# Patient Record
Sex: Female | Born: 1940 | Race: White | Hispanic: No | Marital: Married | Smoking: Former smoker
Health system: Southern US, Community
[De-identification: ages and names within clinical notes are randomized; demographics above are authoritative.]

## PROBLEM LIST (undated history)

## (undated) DIAGNOSIS — E785 Hyperlipidemia, unspecified: Secondary | ICD-10-CM

## (undated) DIAGNOSIS — F329 Major depressive disorder, single episode, unspecified: Secondary | ICD-10-CM

## (undated) DIAGNOSIS — F32A Depression, unspecified: Secondary | ICD-10-CM

## (undated) DIAGNOSIS — F0390 Unspecified dementia without behavioral disturbance: Secondary | ICD-10-CM

## (undated) DIAGNOSIS — F419 Anxiety disorder, unspecified: Secondary | ICD-10-CM

## (undated) HISTORY — PX: COLONOSCOPY: SHX174

---

## 2010-02-22 ENCOUNTER — Emergency Department: Payer: Self-pay | Admitting: Internal Medicine

## 2010-08-05 ENCOUNTER — Ambulatory Visit: Payer: Self-pay

## 2010-08-16 ENCOUNTER — Ambulatory Visit: Payer: Self-pay | Admitting: Gastroenterology

## 2010-08-18 LAB — PATHOLOGY REPORT

## 2017-07-05 ENCOUNTER — Emergency Department: Payer: Medicare Other

## 2017-07-05 ENCOUNTER — Inpatient Hospital Stay
Admission: EM | Admit: 2017-07-05 | Discharge: 2017-07-08 | DRG: 470 | Disposition: A | Discharge status: Skilled Nursing Facility | Payer: Medicare Other | Attending: Internal Medicine | Admitting: Internal Medicine

## 2017-07-05 DIAGNOSIS — S72002A Fracture of unspecified part of neck of left femur, initial encounter for closed fracture: Secondary | ICD-10-CM | POA: Diagnosis present

## 2017-07-05 DIAGNOSIS — E876 Hypokalemia: Secondary | ICD-10-CM | POA: Diagnosis present

## 2017-07-05 DIAGNOSIS — Z880 Allergy status to penicillin: Secondary | ICD-10-CM

## 2017-07-05 DIAGNOSIS — Z419 Encounter for procedure for purposes other than remedying health state, unspecified: Secondary | ICD-10-CM

## 2017-07-05 DIAGNOSIS — W19XXXA Unspecified fall, initial encounter: Secondary | ICD-10-CM | POA: Diagnosis present

## 2017-07-05 DIAGNOSIS — X58XXXA Exposure to other specified factors, initial encounter: Secondary | ICD-10-CM | POA: Diagnosis present

## 2017-07-05 DIAGNOSIS — Z66 Do not resuscitate: Secondary | ICD-10-CM | POA: Diagnosis present

## 2017-07-05 DIAGNOSIS — Z87891 Personal history of nicotine dependence: Secondary | ICD-10-CM

## 2017-07-05 DIAGNOSIS — E785 Hyperlipidemia, unspecified: Secondary | ICD-10-CM | POA: Diagnosis present

## 2017-07-05 DIAGNOSIS — G8918 Other acute postprocedural pain: Secondary | ICD-10-CM

## 2017-07-05 DIAGNOSIS — F0390 Unspecified dementia without behavioral disturbance: Secondary | ICD-10-CM | POA: Diagnosis present

## 2017-07-05 DIAGNOSIS — S72012A Unspecified intracapsular fracture of left femur, initial encounter for closed fracture: Secondary | ICD-10-CM

## 2017-07-05 HISTORY — DX: Hyperlipidemia, unspecified: E78.5

## 2017-07-05 HISTORY — DX: Anxiety disorder, unspecified: F41.9

## 2017-07-05 HISTORY — DX: Depression, unspecified: F32.A

## 2017-07-05 HISTORY — DX: Unspecified dementia, unspecified severity, without behavioral disturbance, psychotic disturbance, mood disturbance, and anxiety: F03.90

## 2017-07-05 HISTORY — DX: Major depressive disorder, single episode, unspecified: F32.9

## 2017-07-05 LAB — TROPONIN I

## 2017-07-05 LAB — PROTIME-INR
INR: 0.94
PROTHROMBIN TIME: 12.6 s (ref 11.4–15.2)

## 2017-07-05 LAB — BASIC METABOLIC PANEL
ANION GAP: 9 (ref 5–15)
BUN: 16 mg/dL (ref 6–20)
CALCIUM: 9.1 mg/dL (ref 8.9–10.3)
CO2: 27 mmol/L (ref 22–32)
CREATININE: 0.77 mg/dL (ref 0.44–1.00)
Chloride: 105 mmol/L (ref 101–111)
Glucose, Bld: 108 mg/dL — ABNORMAL HIGH (ref 65–99)
Potassium: 3.6 mmol/L (ref 3.5–5.1)
SODIUM: 141 mmol/L (ref 135–145)

## 2017-07-05 LAB — CBC
HEMATOCRIT: 39 % (ref 35.0–47.0)
Hemoglobin: 13.3 g/dL (ref 12.0–16.0)
MCH: 30.8 pg (ref 26.0–34.0)
MCHC: 34 g/dL (ref 32.0–36.0)
MCV: 90.5 fL (ref 80.0–100.0)
PLATELETS: 177 10*3/uL (ref 150–440)
RBC: 4.31 MIL/uL (ref 3.80–5.20)
RDW: 13.5 % (ref 11.5–14.5)
WBC: 8.4 10*3/uL (ref 3.6–11.0)

## 2017-07-05 LAB — TYPE AND SCREEN
ABO/RH(D): A POS
ANTIBODY SCREEN: NEGATIVE

## 2017-07-05 LAB — APTT: aPTT: 33 seconds (ref 24–36)

## 2017-07-05 NOTE — ED Triage Notes (Signed)
[facility]. Pt c/o L hip and L leg pain. Has leg bent up sitting on stretcher. Denies hitting head. Hx dementia. EMS denies blood thinner use. EMS reports no deformities seen to L side.

## 2017-07-05 NOTE — H&P (Addendum)
[facility] [place] at [facility]   PATIENT NAME: Donna Moss    MR#:  [id number]  DATE OF BIRTH:  [birth year]  DATE OF ADMISSION:  07/05/2017  PRIMARY CARE PHYSICIAN: Patient, No Pcp Per   REQUESTING/REFERRING PHYSICIAN: Scotty CourtStafford, MD  CHIEF COMPLAINT:   Chief Complaint  Patient presents with  . Fall  . Hip Pain    HISTORY OF PRESENT ILLNESS:  Donna Moss  is a 76 y.o. female who presents with Unwitnessed fall and subsequent left hip pain. Here in the ED on imaging she was found have a left hip fracture. Orthopedic surgery contacted and plans for likely operative repair tomorrow. Hospitalist called for admission.  PAST MEDICAL HISTORY:   Past Medical History:  Diagnosis Date  . Anxiety   . Dementia   . Depression   . HLD (hyperlipidemia)     PAST SURGICAL HISTORY:   Past Surgical History:  Procedure Laterality Date  . COLONOSCOPY      SOCIAL HISTORY:   Social History  Substance Use Topics  . Smoking status: Former Games developermoker  . Smokeless tobacco: Not on file  . Alcohol use No    FAMILY HISTORY:   Family History  Problem Relation Age of Onset  . Cancer Mother     DRUG ALLERGIES:   Allergies  Allergen Reactions  . Penicillins     MEDICATIONS AT HOME:   Prior to Admission medications   Not on File    REVIEW OF SYSTEMS:  Review of Systems  Unable to perform ROS: Dementia     VITAL SIGNS:   Vitals:   07/05/17 2130 07/05/17 2131 07/05/17 2230  BP: 139/77 139/77 (!) 159/75  Pulse: (!) 58 68   Resp: (!) 22 (!) 28 20  Temp:  97.8 F (36.6 C)   TempSrc:  Oral   SpO2: 94% 94%    Wt Readings from Last 3 Encounters:  No data found for Wt    PHYSICAL EXAMINATION:  Physical Exam  Vitals reviewed. Constitutional: She appears well-developed and well-nourished. No distress.  HENT:  Head: Normocephalic and atraumatic.  Mouth/Throat: Oropharynx is clear and moist.  Eyes: Pupils are equal, round, and reactive to  light. Conjunctivae and EOM are normal. No scleral icterus.  Neck: Normal range of motion. Neck supple. No JVD present. No thyromegaly present.  Cardiovascular: Normal rate, regular rhythm and intact distal pulses.  Exam reveals no gallop and no friction rub.   No murmur heard. Respiratory: Effort normal and breath sounds normal. No respiratory distress. She has no wheezes. She has no rales.  GI: Soft. Bowel sounds are normal. She exhibits no distension. There is no tenderness.  Musculoskeletal: Normal range of motion. She exhibits deformity (Left leg shortened). She exhibits no edema.  No arthritis, no gout  Lymphadenopathy:    She has no cervical adenopathy.  Neurological:  Unable to assess due to dementia  Skin: Skin is warm and dry. No rash noted. No erythema.  Psychiatric:  Unable to assess due to dementia    LABORATORY PANEL:   CBC  Recent Labs Lab 07/05/17 2235  WBC 8.4  HGB 13.3  HCT 39.0  PLT 177   ------------------------------------------------------------------------------------------------------------------  Chemistries   Recent Labs Lab 07/05/17 2235  NA 141  K 3.6  CL 105  CO2 27  GLUCOSE 108*  BUN 16  CREATININE 0.77  CALCIUM 9.1   ------------------------------------------------------------------------------------------------------------------  Cardiac Enzymes  Recent Labs Lab 07/05/17 2235  TROPONINI <0.03   ------------------------------------------------------------------------------------------------------------------  RADIOLOGY:  Dg Chest 1 View  Result Date: 07/05/2017 CLINICAL DATA:  Status post fall, with concern for chest injury. Initial encounter. EXAM: CHEST 1 VIEW COMPARISON:  None. FINDINGS: The lungs are well-aerated. Mild left basilar airspace opacity may reflect atelectasis or possibly mild infection. There is no evidence of pleural effusion or pneumothorax. The cardiomediastinal silhouette is borderline normal in size. No  acute osseous abnormalities are seen. Scattered vascular calcifications are seen. IMPRESSION: Mild left basilar airspace opacity may reflect atelectasis or possibly mild infection. No displaced rib fracture seen. Electronically Signed   By: Roanna Raider M.D.   On: 07/05/2017 22:34   Dg Hip Unilat W Or Wo Pelvis 2-3 Views Left  Result Date: 07/05/2017 CLINICAL DATA:  Status post fall, with left hip pain. Initial encounter. EXAM: DG HIP (WITH OR WITHOUT PELVIS) 2-3V LEFT COMPARISON:  None. FINDINGS: There is a mildly displaced subcapital fracture through the left femoral neck. Both femoral heads are seated normally within their respective acetabula. No significant degenerative change is appreciated. The sacroiliac joints are unremarkable in appearance. Calcifications are noted at the right lower quadrant. The visualized bowel gas pattern is grossly unremarkable in appearance. IMPRESSION: Mildly displaced subcapital fracture through the left femoral neck. Electronically Signed   By: Roanna Raider M.D.   On: 07/05/2017 22:34    EKG:   Orders placed or performed during the hospital encounter of 07/05/17  . EKG 12-Lead  . EKG 12-Lead    IMPRESSION AND PLAN:  Principal Problem:   Closed left hip fracture [facility]) - orthopedics surgery consulted, plans for likely operative repair. Cardiac risk assessment is below, patient has no risk factors   Active Problems:   HLD (hyperlipidemia) - continue home meds   Dementia - continue home meds  All the records are reviewed and case discussed with ED provider. Management plans discussed with the patient and/or family.  DVT PROPHYLAXIS: Mechanical only  GI PROPHYLAXIS: None  ADMISSION STATUS: Inpatient  CODE STATUS: DNR Code Status History    This patient does not have a recorded code status. Please follow your organizational policy for patients in this situation.    Advance Directive Documentation     Most Recent Value  Type of Advance Directive   Out of facility DNR (pink MOST or yellow form)  Pre-existing out of facility DNR order (yellow form or pink MOST form)  -  "MOST" Form in Place?  -      TOTAL TIME TAKING CARE OF THIS PATIENT: 45 minutes.   Waunita Sandstrom FIELDING 07/05/2017, 11:37 PM  [facility] [place] [facility]  Office  [phone]  CC: Primary care physician; Patient, No Pcp Per  Note:  This document was prepared using Dragon voice recognition software and may include unintentional dictation errors.

## 2017-07-05 NOTE — ED Notes (Signed)
Pt daughter states pt picks at her arm. Wrapped R FA with gauze in attempt to keep from getting infected.

## 2017-07-05 NOTE — ED Provider Notes (Signed)
[facility] Emergency Department Provider Note  ____________________________________________  Time seen: Approximately 11:02 PM  I have reviewed the triage vital signs and the nursing notes.   HISTORY  Chief Complaint Fall and Hip Pain  Level 5 Caveat: Portions of the History and Physical were unable to be obtained due to altered mental status from chronic dementia.   HPI Donna Moss is a 76 y.o. female sent to the ED due to an unwitnessed fall. Patient complains of left hip pain no other complaints. Denies hitting head. No neck pain or blood thinner use or vision changes. Daughter notes that the patient recently had one of her medications increased, she thinks it was Klonopin and that the patient has been increasingly unsteady on her feet with her medication regimen.  The pain is moderate intensity, worse with movement, better with remaining still. Nonradiating.   Past Medical History:  Diagnosis Date  . Anxiety   . Dementia   . Depression   . HLD (hyperlipidemia)      There are no active problems to display for this patient.    History reviewed. No pertinent surgical history.   Prior to Admission medications   Not on File     Allergies Penicillins   History reviewed. No pertinent family history.  Social History Social History  Substance Use Topics  . Smoking status: Former Games developer  . Smokeless tobacco: Not on file  . Alcohol use No    Review of Systems  Constitutional:   No fever or chills.  ENT:   No sore throat. No rhinorrhea. Cardiovascular:   No chest pain or syncope. Respiratory:   No dyspnea or cough. Gastrointestinal:   Negative for abdominal pain, vomiting and diarrhea.  Musculoskeletal:   Left hip pain as above All other systems reviewed and are negative except as documented above in ROS and HPI.  ____________________________________________   PHYSICAL EXAM:  VITAL SIGNS: ED Triage Vitals  Enc Vitals  Group     BP 07/05/17 2130 139/77     Pulse Rate 07/05/17 2130 (!) 58     Resp 07/05/17 2130 (!) 22     Temp 07/05/17 2131 97.8 F (36.6 C)     Temp Source 07/05/17 2131 Oral     SpO2 07/05/17 2130 94 %     Weight --      Height --      Head Circumference --      Peak Flow --      Pain Score --      Pain Loc --      Pain Edu? --      Excl. in GC? --     Vital signs reviewed, nursing assessments reviewed.   Constitutional:   Alert and orientedTo self. Not in distress. Eyes:   No scleral icterus.  EOMI. No nystagmus. No conjunctival pallor. PERRL. ENT   Head:   Normocephalic and atraumatic.   Nose:   No congestion/rhinnorhea.    Mouth/Throat:   MMM, no pharyngeal erythema. No peritonsillar mass.    Neck:   No meningismus. Full ROM Hematological/Lymphatic/Immunilogical:   No cervical lymphadenopathy. Cardiovascular:   RRR. Symmetric bilateral radial and DP pulses.  No murmurs.  Respiratory:   Normal respiratory effort without tachypnea/retractions. Breath sounds are clear and equal bilaterally. No wheezes/rales/rhonchi. Gastrointestinal:   Soft and nontender. Non distended. There is no CVA tenderness.  No rebound, rigidity, or guarding. Genitourinary:   deferred Musculoskeletal:   Mild tenderness at the left hip  joint particularly from the anterior aspect. Pain with hip flexion on the left. Right leg on affected.  No edema. Neurologic:   Normal speech and language.  Motor grossly intact. No gross focal neurologic deficits are appreciated.  Skin:    Skin is warm, dry and intact. No rash noted.  No petechiae, purpura, or bullae.  ____________________________________________    LABS (pertinent positives/negatives) (all labs ordered are listed, but only abnormal results are displayed) Labs Reviewed  CBC  BASIC METABOLIC PANEL  TROPONIN I  APTT  PROTIME-INR  TYPE AND SCREEN   ____________________________________________   EKG  Interpreted by me Sinus  rhythm rate of 59, normal axis intervals QRS ST segments and T waves  ____________________________________________    RADIOLOGY  Dg Chest 1 View  Result Date: 07/05/2017 CLINICAL DATA:  Status post fall, with concern for chest injury. Initial encounter. EXAM: CHEST 1 VIEW COMPARISON:  None. FINDINGS: The lungs are well-aerated. Mild left basilar airspace opacity may reflect atelectasis or possibly mild infection. There is no evidence of pleural effusion or pneumothorax. The cardiomediastinal silhouette is borderline normal in size. No acute osseous abnormalities are seen. Scattered vascular calcifications are seen. IMPRESSION: Mild left basilar airspace opacity may reflect atelectasis or possibly mild infection. No displaced rib fracture seen. Electronically Signed   By: Roanna RaiderJeffery  Chang M.D.   On: 07/05/2017 22:34   Dg Hip Unilat W Or Wo Pelvis 2-3 Views Left  Result Date: 07/05/2017 CLINICAL DATA:  Status post fall, with left hip pain. Initial encounter. EXAM: DG HIP (WITH OR WITHOUT PELVIS) 2-3V LEFT COMPARISON:  None. FINDINGS: There is a mildly displaced subcapital fracture through the left femoral neck. Both femoral heads are seated normally within their respective acetabula. No significant degenerative change is appreciated. The sacroiliac joints are unremarkable in appearance. Calcifications are noted at the right lower quadrant. The visualized bowel gas pattern is grossly unremarkable in appearance. IMPRESSION: Mildly displaced subcapital fracture through the left femoral neck. Electronically Signed   By: Roanna RaiderJeffery  Chang M.D.   On: 07/05/2017 22:34    ____________________________________________   PROCEDURES Procedures  ____________________________________________   INITIAL IMPRESSION / ASSESSMENT AND PLAN / ED COURSE  Pertinent labs & imaging results that were available during my care of the patient were reviewed by me and considered in my medical decision making (see chart for  details).  Patient presents with left hip pain after a fall. X-ray shows a subcapital left hip fracture, mildly displaced. Discussed with ortho Dr. Rosita KeaMenz who will evaluate, possible surgical repair tomorrow. Case discussed with hospitalist for admission. No other traumatic injuries identified. Labs ordered as preop workup.      ____________________________________________   FINAL CLINICAL IMPRESSION(S) / ED DIAGNOSES  Final diagnoses:  Closed subcapital fracture of femur, left, initial encounter (HCC)  Fall, initial encounter  Dementia without behavioral disturbance, unspecified dementia type      New Prescriptions   No medications on file     Portions of this note were generated with dragon dictation software. Dictation errors may occur despite best attempts at proofreading.    Sharman CheekStafford, Yordin Rhoda, MD 07/05/17 [phone]

## 2017-07-06 ENCOUNTER — Encounter
Admission: EM | Disposition: A | Discharge status: Skilled Nursing Facility | Payer: Self-pay | Source: Home / Self Care | Attending: Internal Medicine

## 2017-07-06 ENCOUNTER — Inpatient Hospital Stay: Payer: Medicare Other | Admitting: Anesthesiology

## 2017-07-06 ENCOUNTER — Inpatient Hospital Stay: Payer: Medicare Other

## 2017-07-06 HISTORY — PX: HIP ARTHROPLASTY: SHX981

## 2017-07-06 LAB — URINALYSIS, COMPLETE (UACMP) WITH MICROSCOPIC
Bilirubin Urine: NEGATIVE
GLUCOSE, UA: NEGATIVE mg/dL
Hgb urine dipstick: NEGATIVE
Ketones, ur: NEGATIVE mg/dL
Leukocytes, UA: NEGATIVE
Nitrite: NEGATIVE
PROTEIN: NEGATIVE mg/dL
Specific Gravity, Urine: 1.009 (ref 1.005–1.030)
pH: 8 (ref 5.0–8.0)

## 2017-07-06 LAB — BASIC METABOLIC PANEL
ANION GAP: 6 (ref 5–15)
Anion gap: 7 (ref 5–15)
BUN: 16 mg/dL (ref 6–20)
BUN: 11 mg/dL (ref 6–20)
CALCIUM: 8.8 mg/dL — AB (ref 8.9–10.3)
CHLORIDE: 105 mmol/L (ref 101–111)
CO2: 29 mmol/L (ref 22–32)
CO2: 28 mmol/L (ref 22–32)
CREATININE: 0.69 mg/dL (ref 0.44–1.00)
Calcium: 9.2 mg/dL (ref 8.9–10.3)
Chloride: 105 mmol/L (ref 101–111)
Creatinine, Ser: 0.64 mg/dL (ref 0.44–1.00)
GFR calc Af Amer: >60 mL/min (ref >60)
GFR calc non Af Amer: >60 mL/min (ref >60)
GLUCOSE: 97 mg/dL (ref 65–99)
GLUCOSE: 113 mg/dL — AB (ref 65–99)
Potassium: 2.9 mmol/L — ABNORMAL LOW (ref 3.5–5.1)
Potassium: 3.7 mmol/L (ref 3.5–5.1)
SODIUM: 141 mmol/L (ref 135–145)
SODIUM: 139 mmol/L (ref 135–145)

## 2017-07-06 LAB — CBC
HCT: 38.9 % (ref 35.0–47.0)
Hemoglobin: 13.1 g/dL (ref 12.0–16.0)
MCH: 30.6 pg (ref 26.0–34.0)
MCHC: 33.6 g/dL (ref 32.0–36.0)
MCV: 90.9 fL (ref 80.0–100.0)
PLATELETS: 173 10*3/uL (ref 150–440)
RBC: 4.28 MIL/uL (ref 3.80–5.20)
RDW: 13.4 % (ref 11.5–14.5)
WBC: 7.2 10*3/uL (ref 3.6–11.0)

## 2017-07-06 LAB — TSH: TSH: 1.79 u[IU]/mL (ref 0.350–4.500)

## 2017-07-06 LAB — MAGNESIUM: MAGNESIUM: 1.8 mg/dL (ref 1.7–2.4)

## 2017-07-06 LAB — MRSA PCR SCREENING: MRSA by PCR: NEGATIVE

## 2017-07-06 SURGERY — HEMIARTHROPLASTY, HIP, DIRECT ANTERIOR APPROACH, FOR FRACTURE
Anesthesia: General | Site: Hip | Laterality: Left | Wound class: Clean

## 2017-07-06 MED ORDER — BUPIVACAINE-EPINEPHRINE 0.25% -1:200000 IJ SOLN
INTRAMUSCULAR | Status: DC | PRN
  Administered 2017-07-06: 30 mL

## 2017-07-06 MED ORDER — MAGNESIUM HYDROXIDE 400 MG/5ML PO SUSP: 30.0000 mL | Freq: Every day | ORAL | Status: DC | PRN

## 2017-07-06 MED ORDER — POTASSIUM CHLORIDE 10 MEQ/100ML IV SOLN
10.0000 meq | INTRAVENOUS | Status: DC | PRN
  Administered 2017-07-06 (×2): 10 meq via INTRAVENOUS
  Filled 2017-07-06 (×3): qty 100

## 2017-07-06 MED ORDER — METOPROLOL TARTRATE 5 MG/5ML IV SOLN
INTRAVENOUS | Status: AC
  Administered 2017-07-06: 2.5 mg via INTRAVENOUS
  Filled 2017-07-06: qty 5

## 2017-07-06 MED ORDER — DOCUSATE SODIUM 100 MG PO CAPS
100.0000 mg | ORAL_CAPSULE | Freq: Two times a day (BID) | ORAL | Status: DC
  Administered 2017-07-07 (×2): 100 mg via ORAL
  Filled 2017-07-06 (×3): qty 1

## 2017-07-06 MED ORDER — ONDANSETRON HCL 4 MG/2ML IJ SOLN
INTRAMUSCULAR | Status: AC
  Filled 2017-07-06: qty 2

## 2017-07-06 MED ORDER — METOPROLOL TARTRATE 5 MG/5ML IV SOLN
2.5000 mg | Freq: Once | INTRAVENOUS | Status: AC
  Administered 2017-07-06: 2.5 mg via INTRAVENOUS

## 2017-07-06 MED ORDER — ENOXAPARIN SODIUM 30 MG/0.3ML ~~LOC~~ SOLN: 30.0000 mg | SUBCUTANEOUS | Status: DC

## 2017-07-06 MED ORDER — SUGAMMADEX SODIUM 200 MG/2ML IV SOLN
INTRAVENOUS | Status: DC | PRN
  Administered 2017-07-06: 100 mg via INTRAVENOUS

## 2017-07-06 MED ORDER — MORPHINE SULFATE (PF) 2 MG/ML IV SOLN: 1.0000 mg | INTRAVENOUS | Status: DC | PRN

## 2017-07-06 MED ORDER — ROCURONIUM BROMIDE 100 MG/10ML IV SOLN
INTRAVENOUS | Status: DC | PRN
  Administered 2017-07-06 (×2): 20 mg via INTRAVENOUS
  Administered 2017-07-06: 30 mg via INTRAVENOUS

## 2017-07-06 MED ORDER — DONEPEZIL HCL 5 MG PO TABS
10.0000 mg | ORAL_TABLET | Freq: Every day | ORAL | Status: DC
  Administered 2017-07-07: 10 mg via ORAL
  Filled 2017-07-06: qty 2
  Filled 2017-07-06 (×2): qty 1

## 2017-07-06 MED ORDER — BISACODYL 10 MG RE SUPP: 10.0000 mg | Freq: Every day | RECTAL | Status: DC | PRN

## 2017-07-06 MED ORDER — ENOXAPARIN SODIUM 40 MG/0.4ML ~~LOC~~ SOLN
40.0000 mg | SUBCUTANEOUS | Status: DC
  Administered 2017-07-07 – 2017-07-08 (×2): 40 mg via SUBCUTANEOUS
  Filled 2017-07-06 (×2): qty 0.4

## 2017-07-06 MED ORDER — FENTANYL CITRATE (PF) 100 MCG/2ML IJ SOLN: 25.0000 ug | INTRAMUSCULAR | Status: DC | PRN

## 2017-07-06 MED ORDER — ROCURONIUM BROMIDE 50 MG/5ML IV SOLN
INTRAVENOUS | Status: AC
  Filled 2017-07-06: qty 1

## 2017-07-06 MED ORDER — ACETAMINOPHEN 10 MG/ML IV SOLN
INTRAVENOUS | Status: DC | PRN
  Administered 2017-07-06: 1000 mg via INTRAVENOUS

## 2017-07-06 MED ORDER — DEXAMETHASONE SODIUM PHOSPHATE 10 MG/ML IJ SOLN
INTRAMUSCULAR | Status: DC | PRN
  Administered 2017-07-06: 10 mg via INTRAVENOUS

## 2017-07-06 MED ORDER — LIDOCAINE HCL (CARDIAC) 20 MG/ML IV SOLN
INTRAVENOUS | Status: DC | PRN
  Administered 2017-07-06: 60 mg via INTRAVENOUS

## 2017-07-06 MED ORDER — ONDANSETRON HCL 4 MG/2ML IJ SOLN
INTRAMUSCULAR | Status: DC | PRN
  Administered 2017-07-06: 4 mg via INTRAVENOUS

## 2017-07-06 MED ORDER — ONDANSETRON HCL 4 MG PO TABS: 4.0000 mg | ORAL_TABLET | Freq: Four times a day (QID) | ORAL | Status: DC | PRN

## 2017-07-06 MED ORDER — ENOXAPARIN SODIUM 40 MG/0.4ML ~~LOC~~ SOLN: 40.0000 mg | SUBCUTANEOUS | Status: DC

## 2017-07-06 MED ORDER — ONDANSETRON HCL 4 MG/2ML IJ SOLN: 4.0000 mg | Freq: Four times a day (QID) | INTRAMUSCULAR | Status: DC | PRN

## 2017-07-06 MED ORDER — SODIUM CHLORIDE 0.9 % IV SOLN
INTRAVENOUS | Status: AC
  Administered 2017-07-06: 02:00:00 via INTRAVENOUS

## 2017-07-06 MED ORDER — CLINDAMYCIN PHOSPHATE 600 MG/50ML IV SOLN
600.0000 mg | Freq: Once | INTRAVENOUS | Status: AC
  Administered 2017-07-06: 600 mg via INTRAVENOUS
  Filled 2017-07-06 (×2): qty 50

## 2017-07-06 MED ORDER — SODIUM CHLORIDE 0.9 % IV SOLN
INTRAVENOUS | Status: DC
  Administered 2017-07-06 – 2017-07-07 (×2): via INTRAVENOUS

## 2017-07-06 MED ORDER — FENTANYL CITRATE (PF) 100 MCG/2ML IJ SOLN
INTRAMUSCULAR | Status: DC | PRN
  Administered 2017-07-06 (×2): 25 ug via INTRAVENOUS
  Administered 2017-07-06: 50 ug via INTRAVENOUS
  Administered 2017-07-06 (×2): 25 ug via INTRAVENOUS

## 2017-07-06 MED ORDER — MAGNESIUM CITRATE PO SOLN
1.0000 | Freq: Once | ORAL | Status: DC | PRN
  Filled 2017-07-06: qty 296

## 2017-07-06 MED ORDER — PAROXETINE HCL 20 MG PO TABS
30.0000 mg | ORAL_TABLET | Freq: Every day | ORAL | Status: DC
  Administered 2017-07-07 – 2017-07-08 (×2): 30 mg via ORAL
  Filled 2017-07-06 (×2): qty 2

## 2017-07-06 MED ORDER — HYDROCODONE-ACETAMINOPHEN 5-325 MG PO TABS: 1.0000 | ORAL_TABLET | ORAL | Status: DC | PRN

## 2017-07-06 MED ORDER — PHENOL 1.4 % MT LIQD
1.0000 | OROMUCOSAL | Status: DC | PRN
  Filled 2017-07-06: qty 177

## 2017-07-06 MED ORDER — PRAVASTATIN SODIUM 20 MG PO TABS
20.0000 mg | ORAL_TABLET | Freq: Every day | ORAL | Status: DC
Start: 2017-07-06 — End: 2017-07-08
  Administered 2017-07-07: 20 mg via ORAL
  Filled 2017-07-06 (×2): qty 1

## 2017-07-06 MED ORDER — ACETAMINOPHEN 325 MG PO TABS: 650.0000 mg | ORAL_TABLET | Freq: Four times a day (QID) | ORAL | Status: DC | PRN

## 2017-07-06 MED ORDER — ONDANSETRON HCL 4 MG/2ML IJ SOLN: 4.0000 mg | Freq: Once | INTRAMUSCULAR | Status: DC | PRN

## 2017-07-06 MED ORDER — PROPOFOL 10 MG/ML IV BOLUS
INTRAVENOUS | Status: AC
  Filled 2017-07-06: qty 20

## 2017-07-06 MED ORDER — PROPOFOL 10 MG/ML IV BOLUS
INTRAVENOUS | Status: DC | PRN
  Administered 2017-07-06: 70 mg via INTRAVENOUS

## 2017-07-06 MED ORDER — POTASSIUM CHLORIDE 20 MEQ PO PACK: 80.0000 meq | PACK | Freq: Once | ORAL | Status: DC

## 2017-07-06 MED ORDER — LACTATED RINGERS IV SOLN
INTRAVENOUS | Status: DC | PRN
  Administered 2017-07-06 (×2): via INTRAVENOUS

## 2017-07-06 MED ORDER — OXYCODONE HCL 5 MG PO TABS: 5.0000 mg | ORAL_TABLET | ORAL | Status: DC | PRN

## 2017-07-06 MED ORDER — ACETAMINOPHEN 500 MG PO TABS
1000.0000 mg | ORAL_TABLET | Freq: Four times a day (QID) | ORAL | Status: AC
  Administered 2017-07-07: 1000 mg via ORAL
  Filled 2017-07-06: qty 2

## 2017-07-06 MED ORDER — NEOMYCIN-POLYMYXIN B GU 40-200000 IR SOLN
Status: DC | PRN
  Administered 2017-07-06: 4 mL

## 2017-07-06 MED ORDER — DIPHENHYDRAMINE HCL 12.5 MG/5ML PO ELIX
12.5000 mg | ORAL_SOLUTION | ORAL | Status: DC | PRN
  Administered 2017-07-07: 12.5 mg via ORAL
  Filled 2017-07-06: qty 5

## 2017-07-06 MED ORDER — ALUM & MAG HYDROXIDE-SIMETH 200-200-20 MG/5ML PO SUSP: 30.0000 mL | ORAL | Status: DC | PRN

## 2017-07-06 MED ORDER — MENTHOL 3 MG MT LOZG
1.0000 | LOZENGE | OROMUCOSAL | Status: DC | PRN
  Filled 2017-07-06: qty 9

## 2017-07-06 MED ORDER — ACETAMINOPHEN 650 MG RE SUPP: 650.0000 mg | Freq: Four times a day (QID) | RECTAL | Status: DC | PRN

## 2017-07-06 MED ORDER — FENTANYL CITRATE (PF) 100 MCG/2ML IJ SOLN
INTRAMUSCULAR | Status: AC
  Filled 2017-07-06: qty 2

## 2017-07-06 SURGICAL SUPPLY — 50 items
BLADE SAW SAG 18.5X105 (BLADE) ×3 IMPLANT
BNDG COHESIVE 6X5 TAN STRL LF (GAUZE/BANDAGES/DRESSINGS) ×9 IMPLANT
CANISTER SUCT 1200ML W/VALVE (MISCELLANEOUS) ×3 IMPLANT
CAPT HIP HEMI 2 ×3 IMPLANT
CATH FOL LEG HOLDER (MISCELLANEOUS) ×3 IMPLANT
CATH TRAY METER 16FR LF (MISCELLANEOUS) ×3 IMPLANT
CHLORAPREP W/TINT 26ML (MISCELLANEOUS) ×3 IMPLANT
DRAPE C-ARM XRAY 36X54 (DRAPES) ×3 IMPLANT
DRAPE INCISE IOBAN 66X60 STRL (DRAPES) IMPLANT
DRAPE POUCH INSTRU U-SHP 10X18 (DRAPES) ×3 IMPLANT
DRAPE SHEET LG 3/4 BI-LAMINATE (DRAPES) ×9 IMPLANT
DRAPE TABLE BACK 80X90 (DRAPES) ×3 IMPLANT
DRESSING SURGICEL FIBRLLR 1X2 (HEMOSTASIS) ×2 IMPLANT
DRSG OPSITE POSTOP 4X8 (GAUZE/BANDAGES/DRESSINGS) ×6 IMPLANT
DRSG SURGICEL FIBRILLAR 1X2 (HEMOSTASIS) ×6
ELECT BLADE 6.5 EXT (BLADE) ×3 IMPLANT
ELECT REM PT RETURN 9FT ADLT (ELECTROSURGICAL) ×3
ELECTRODE REM PT RTRN 9FT ADLT (ELECTROSURGICAL) ×1 IMPLANT
EVACUATOR 1/8 PVC DRAIN (DRAIN) IMPLANT
GLOVE BIOGEL PI IND STRL 9 (GLOVE) ×1 IMPLANT
GLOVE BIOGEL PI INDICATOR 9 (GLOVE) ×2
GLOVE SURG SYN 9.0  PF PI (GLOVE) ×4
GLOVE SURG SYN 9.0 PF PI (GLOVE) ×2 IMPLANT
GOWN SRG 2XL LVL 4 RGLN SLV (GOWNS) ×1 IMPLANT
GOWN STRL NON-REIN 2XL LVL4 (GOWNS) ×2
GOWN STRL REUS W/ TWL LRG LVL3 (GOWN DISPOSABLE) ×1 IMPLANT
GOWN STRL REUS W/TWL LRG LVL3 (GOWN DISPOSABLE) ×2
HOOD PEEL AWAY FLYTE STAYCOOL (MISCELLANEOUS) ×3 IMPLANT
KIT PREVENA INCISION MGT 13 (CANNISTER) ×3 IMPLANT
MAT BLUE FLOOR 46X72 FLO (MISCELLANEOUS) ×3 IMPLANT
NDL SAFETY 18GX1.5 (NEEDLE) ×3 IMPLANT
NEEDLE SPNL 18GX3.5 QUINCKE PK (NEEDLE) ×3 IMPLANT
NS IRRIG 1000ML POUR BTL (IV SOLUTION) ×3 IMPLANT
PACK HIP COMPR (MISCELLANEOUS) ×3 IMPLANT
SOL PREP PVP 2OZ (MISCELLANEOUS) ×3
SOLUTION PREP PVP 2OZ (MISCELLANEOUS) ×1 IMPLANT
SPONGE DRAIN TRACH 4X4 STRL 2S (GAUZE/BANDAGES/DRESSINGS) ×3 IMPLANT
STAPLER SKIN PROX 35W (STAPLE) ×3 IMPLANT
STRAP SAFETY BODY (MISCELLANEOUS) ×3 IMPLANT
SUT DVC 2 QUILL PDO  T11 36X36 (SUTURE) ×2
SUT DVC 2 QUILL PDO T11 36X36 (SUTURE) ×1 IMPLANT
SUT SILK 0 (SUTURE) ×2
SUT SILK 0 30XBRD TIE 6 (SUTURE) ×1 IMPLANT
SUT V-LOC 90 ABS DVC 3-0 CL (SUTURE) ×3 IMPLANT
SUT VIC AB 1 CT1 36 (SUTURE) ×3 IMPLANT
SYR 20CC LL (SYRINGE) ×3 IMPLANT
SYR 30ML LL (SYRINGE) ×3 IMPLANT
TAPE MICROFOAM 4IN (TAPE) ×3 IMPLANT
TOWEL OR 17X26 4PK STRL BLUE (TOWEL DISPOSABLE) ×3 IMPLANT
WND VAC CANISTER 500ML (MISCELLANEOUS) ×3 IMPLANT

## 2017-07-06 NOTE — Anesthesia Preprocedure Evaluation (Signed)
Anesthesia Evaluation  Patient identified by MRN, date of birth, ID band Patient awake    Reviewed: Allergy & Precautions, H&P , NPO status , Patient's Chart, lab work & pertinent test results, reviewed documented beta blocker date and time   History of Anesthesia Complications Negative for: history of anesthetic complications  Airway       Comment: Non-cooperative Dental  (+) Edentulous Upper, Edentulous Lower, Dental Advidsory Given   Pulmonary neg pulmonary ROS, former smoker,           Cardiovascular Exercise Tolerance: Good negative cardio ROS       Neuro/Psych PSYCHIATRIC DISORDERS (Dementia) negative neurological ROS  negative psych ROS   GI/Hepatic negative GI ROS, Neg liver ROS,   Endo/Other  negative endocrine ROS  Renal/GU negative Renal ROS  negative genitourinary   Musculoskeletal   Abdominal   Peds  Hematology negative hematology ROS (+)   Anesthesia Other Findings Past Medical History: No date: Anxiety No date: Dementia No date: Depression No date: HLD (hyperlipidemia)   Reproductive/Obstetrics negative OB ROS                             Anesthesia Physical Anesthesia Plan  ASA: III  Anesthesia Plan: General   Post-op Pain Management:    Induction: Intravenous  PONV Risk Score and Plan: 3 and Ondansetron, Dexamethasone and Midazolam  Airway Management Planned: Oral ETT  Additional Equipment:   Intra-op Plan:   Post-operative Plan: Extubation in OR  Informed Consent: I have reviewed the patients History and Physical, chart, labs and discussed the procedure including the risks, benefits and alternatives for the proposed anesthesia with the patient or authorized representative who has indicated his/her understanding and acceptance.   Dental Advisory Given  Plan Discussed with: Anesthesiologist, CRNA and Surgeon  Anesthesia Plan Comments:          Anesthesia Quick Evaluation

## 2017-07-06 NOTE — Op Note (Signed)
07/05/2017 - 07/06/2017  4:19 PM  PATIENT:  Donna AuerbachKarin E Moss  76 y.o. female  PRE-OPERATIVE DIAGNOSIS:  LEFT HIP FRACTURE, displaced femoral neck  POST-OPERATIVE DIAGNOSIS:  LEFT HIP FRACTURE, displaced femoral neck   PROCEDURE:  Procedure(s): ARTHROPLASTY BIPOLAR HIP (HEMIARTHROPLASTY) (Left)  SURGEON: Leitha SchullerMichael J Georgenia Salim, MD  ASSISTANTS: None  ANESTHESIA:   general  EBL:  Total I/O In: 200 [IV Piggyback:200] Out: 500 [Urine:200; Blood:300]  BLOOD ADMINISTERED:none  DRAINS: none   LOCAL MEDICATIONS USED:  MARCAINE     SPECIMEN:  Source of Specimen:  Left femoral head  DISPOSITION OF SPECIMEN:  PATHOLOGY  COUNTS:  YES  TOURNIQUET:  * No tourniquets in log *  IMPLANTS: Medacta AMIS 3 standard stem with 47 mm bipolar head and S 28 mm metal head  DICTATION: .Dragon Dictation   The patient was brought to the operating room and after spinal anesthesia was obtained patient was placed on the operative table with the ipsilateral foot into the Medacta attachment, contralateral leg on a well-padded table. C-arm was brought in and preop template x-ray taken. After prepping and draping in usual sterile fashion appropriate patient identification and timeout procedures were completed. Anterior approach to the hip was obtained and centered over the greater trochanter and TFL muscle. The subcutaneous tissue was incised hemostasis being achieved by electrocautery. TFL fascia was incised and the muscle retracted laterally deep retractor placed. The lateral femoral circumflex vessels were identified and l cauterized d. The anterior capsule was exposed and a capsulotomy performed. The neck was identified and a femoral neck cut carried out with a saw. The neck was removed and the head then removed with a corkscrew and measured. Trials were placed and a 47 seemed to fit best. 47 mm bipolar head then chosen. The leg was then externally rotated and ischiofemoral and pubofemoral releases carried out. The femur  was sequentially broached to a size 3, size standard trials were placed and the final components chosen. The 3 standard stem was inserted along with a metal S 28 mm head and 47 mm bipolar liner. The hip was reduced and was stable the wound was thoroughly irrigated . Fibrillar was placed along the posterior capsule and medial neck to minimize postop bleeding The deep fascia was closed using a heavy Quill after infiltration of 30 cc of quarter percent Sensorcaine with epinephrine.3-0 v-loc to close the skin with skin staples Xeroform and honeycomb dressing then applied  PLAN OF CARE: Continue as inpatient

## 2017-07-06 NOTE — Progress Notes (Signed)
[facility] [place] at [facility]   PATIENT NAME: Donna Moss    MR#:  [id number]  DATE OF BIRTH:  1941/10/05  SUBJECTIVE:  CHIEF COMPLAINT:   Chief Complaint  Patient presents with  . Fall  . Hip Pain     Lives in a memory care unit, came after a fall and fracture on her hip. Denies any complaints. As per daughter she frequently runs UTIs. At her baseline she walks without any support as per her daughter.  REVIEW OF SYSTEMS:   Because of dementia patient is not able to give a review of system. ROS  DRUG ALLERGIES:   Allergies  Allergen Reactions  . Penicillins     VITALS:  Blood pressure 140/62, pulse 72, temperature 98 F (36.7 C), temperature source Oral, resp. rate 16, height 5\' 4"  (1.626 m), weight 51.1 kg (112 lb 9.6 oz), SpO2 99 %.  PHYSICAL EXAMINATION:  GENERAL:  76 y.o.-year-old patient lying in the bed with no acute distress.  EYES: Pupils equal, round, reactive to light and accommodation. No scleral icterus. Extraocular muscles intact.  HEENT: Head atraumatic, normocephalic. Oropharynx and nasopharynx clear.  NECK:  Supple, no jugular venous distention. No thyroid enlargement, no tenderness.  LUNGS: Normal breath sounds bilaterally, no wheezing, rales,rhonchi or crepitation. No use of accessory muscles of respiration.  CARDIOVASCULAR: S1, S2 normal. No murmurs, rubs, or gallops.  ABDOMEN: Soft, nontender, nondistended. Bowel sounds present. No organomegaly or mass.  EXTREMITIES: No pedal edema, cyanosis, or clubbing.  NEUROLOGIC: Cranial nerves II through XII are intact. Muscle strength 4/5 in Both upper extremities, lower extremities did not more due to fracture in her hip. Sensation intact. Gait not checked.  PSYCHIATRIC: The patient is alert and oriented x 1.  SKIN: No obvious rash, lesion, or ulcer.   Physical Exam LABORATORY PANEL:   CBC  Recent Labs Lab 07/06/17 0250  WBC 7.2  HGB 13.1  HCT 38.9  PLT 173    ------------------------------------------------------------------------------------------------------------------  Chemistries   Recent Labs Lab 07/06/17 1144  NA 139  K 3.7  CL 105  CO2 28  GLUCOSE 113*  BUN 11  CREATININE 0.64  CALCIUM 8.8*  MG 1.8   ------------------------------------------------------------------------------------------------------------------  Cardiac Enzymes  Recent Labs Lab 07/05/17 2235  TROPONINI <0.03   ------------------------------------------------------------------------------------------------------------------  RADIOLOGY:  Dg Chest 1 View  Result Date: 07/05/2017 CLINICAL DATA:  Status post fall, with concern for chest injury. Initial encounter. EXAM: CHEST 1 VIEW COMPARISON:  None. FINDINGS: The lungs are well-aerated. Mild left basilar airspace opacity may reflect atelectasis or possibly mild infection. There is no evidence of pleural effusion or pneumothorax. The cardiomediastinal silhouette is borderline normal in size. No acute osseous abnormalities are seen. Scattered vascular calcifications are seen. IMPRESSION: Mild left basilar airspace opacity may reflect atelectasis or possibly mild infection. No displaced rib fracture seen. Electronically Signed   By: Roanna Raider M.D.   On: 07/05/2017 22:34   Dg Hip Unilat W Or Wo Pelvis 2-3 Views Left  Result Date: 07/05/2017 CLINICAL DATA:  Status post fall, with left hip pain. Initial encounter. EXAM: DG HIP (WITH OR WITHOUT PELVIS) 2-3V LEFT COMPARISON:  None. FINDINGS: There is a mildly displaced subcapital fracture through the left femoral neck. Both femoral heads are seated normally within their respective acetabula. No significant degenerative change is appreciated. The sacroiliac joints are unremarkable in appearance. Calcifications are noted at the right lower quadrant. The visualized bowel gas pattern is grossly unremarkable in appearance. IMPRESSION: Mildly displaced subcapital  fracture through the left femoral neck. Electronically Signed   By: Roanna RaiderJeffery  Chang M.D.   On: 07/05/2017 22:34    ASSESSMENT AND PLAN:   Principal Problem:   Closed left hip fracture (HCC) Active Problems:   HLD (hyperlipidemia)   Dementia   * Left hip Fracture   Plan is to do surgery today.   Patient is optimized for surgery, pain management and rehabilitation after surgery as allowed by orthopedic team.  * Fall   As per daughter the patient has gradual worsening in her strength and going down hill in her feeding and strength. We will check urinalysis as daughter complains patient is having frequent UTIs.   Check TSH also.  * HLD (hyperlipidemia) - continue home meds  * Dementia - continue home meds  All the records are reviewed and case discussed with ED provider. Management plans discussed with the patient and/or family.  DVT PROPHYLAXIS: Mechanical only  GI PROPHYLAXIS: None   All the records are reviewed and case discussed with Care Management/Social Workerr. Management plans discussed with the patient, family and they are in agreement.  CODE STATUS: full.  TOTAL TIME TAKING CARE OF THIS PATIENT: 35 minutes.     POSSIBLE D/C IN 1-2 DAYS, DEPENDING ON CLINICAL CONDITION.   Altamese DillingVACHHANI, Saliyah Gillin M.D on 07/06/2017   Between 7am to 6pm - Pager - [phone]  After 6pm go to [email or web address] - password [email or web address]  [facility] [place] [facility]  Office  [phone]  CC: Primary care physician; Patient, No Pcp Per  Note: This dictation was prepared with Dragon dictation along with smaller phrase technology. Any transcriptional errors that result from this process are unintentional.

## 2017-07-06 NOTE — Transfer of Care (Signed)
Immediate Anesthesia Transfer of Care Note  Patient: Donna Moss  Procedure(s) Performed: Procedure(s): ARTHROPLASTY BIPOLAR HIP (HEMIARTHROPLASTY) (Left)  Patient Location: PACU  Anesthesia Type:General  Level of Consciousness: patient cooperative and responds to stimulation  Airway & Oxygen Therapy: Patient Spontanous Breathing and Patient connected to face mask oxygen  Post-op Assessment: Report given to RN and Post -op Vital signs reviewed and stable  Post vital signs: Reviewed and stable  Last Vitals:  Vitals:   07/06/17 0753 07/06/17 1621  BP: 140/62   Pulse: 72   Resp: 16   Temp: 36.7 C (P) 36.8 C  SpO2: 99%     Last Pain:  Vitals:   07/06/17 1621  TempSrc:   PainSc: (P) Asleep         Complications: No apparent anesthesia complications

## 2017-07-06 NOTE — Progress Notes (Signed)
Pr with an order for lovenox 30mg  q 24hr. Pt w/ a crcl >7430ml/min and weight >45kg. Order increased to 40mg  daily per protocol  Olene FlossMelissa D Aviella Disbrow, Pharm.D, BCPS Clinical Pharmacist

## 2017-07-06 NOTE — Care Management Note (Signed)
Case Management Note  Patient Details  Name: Donna Moss MRN: [id number] Date of Birth: 12-09-1940  Subjective/Objective:  Chart reviewed. [facility] Was walking prior to admission. Case dicussed with attending. Patient will likely need SNF. CSW aware.                    Action/Plan:   Expected Discharge Date:  07/09/17               Expected Discharge Plan:  Home w Home Health Services  In-House Referral:  Clinical Social Work  Discharge planning Services  CM Consult  Post Acute Care Choice:    Choice offered to:     DME Arranged:    DME Agency:     HH Arranged:    HH Agency:     Status of Service:  In process, will continue to follow  If discussed at Long Length of Stay Meetings, dates discussed:    Additional Comments:  Marily MemosLisa M Warrene Kapfer, RN 07/06/2017, 10:57 AM

## 2017-07-06 NOTE — Consult Note (Signed)
Patient's 76 year old female who suffered a fall yesterday she suffers from significant confusion and is [facility]. She walks without assistive device. X-ray showed a displaced femoral neck fracture that is unstable without any significant arthritis On exam she is laying on her left side she is able flex extend the toes and the leg is not externally rotated. She has palpable pulses. Impression is displaced femoral neck fracture in a confused ambulator Plan is left hip hemiarthroplasty, site marked, risks, benefits, and alternatives discussed with patient's daughter. Potassium was low last evening and supplement given this morning hopefully we'll normalize for surgery later today

## 2017-07-06 NOTE — Progress Notes (Signed)
CH rounding unit visited with pt. Pt was lying on the bed not feeling good and family member were at bedside. CH talked to family members and noticed that they needed time to spent with pt. CH excused himself but promised to visit with pt as needed to provide pastoral support and care. CH provided pastoral support and a ministry of presence.

## 2017-07-06 NOTE — Anesthesia Post-op Follow-up Note (Signed)
Anesthesia QCDR form completed.        

## 2017-07-06 NOTE — Anesthesia Procedure Notes (Signed)
Procedure Name: Intubation Date/Time: 07/06/2017 3:04 PM Performed by: Silvana Newness Pre-anesthesia Checklist: Patient identified, Emergency Drugs available, Suction available, Patient being monitored and Timeout performed Patient Re-evaluated:Patient Re-evaluated prior to induction Oxygen Delivery Method: Circle system utilized Preoxygenation: Pre-oxygenation with 100% oxygen Induction Type: IV induction Ventilation: Mask ventilation without difficulty Laryngoscope Size: Mac and 3 Grade View: Grade I Tube type: Oral Tube size: 7.0 mm Number of attempts: 1 Airway Equipment and Method: Stylet Placement Confirmation: ETT inserted through vocal cords under direct vision,  positive ETCO2 and breath sounds checked- equal and bilateral Secured at: 17 cm Tube secured with: Tape Dental Injury: Teeth and Oropharynx as per pre-operative assessment

## 2017-07-06 NOTE — Progress Notes (Signed)
Patient potassium level was 2.9. Contacted Dr Rosita KeaMenz to question if patient needed to be on a tele monitor. He said no because we were supplementing and rechecking the potassium level in 2.5 hours.

## 2017-07-07 ENCOUNTER — Encounter: Payer: Self-pay | Admitting: Orthopedic Surgery

## 2017-07-07 LAB — CBC
HCT: 34.3 % — ABNORMAL LOW (ref 35.0–47.0)
Hemoglobin: 11.7 g/dL — ABNORMAL LOW (ref 12.0–16.0)
MCH: 30.5 pg (ref 26.0–34.0)
MCHC: 34.2 g/dL (ref 32.0–36.0)
MCV: 89.1 fL (ref 80.0–100.0)
PLATELETS: 174 10*3/uL (ref 150–440)
RBC: 3.85 MIL/uL (ref 3.80–5.20)
RDW: 13.3 % (ref 11.5–14.5)
WBC: 10.4 10*3/uL (ref 3.6–11.0)

## 2017-07-07 LAB — BASIC METABOLIC PANEL
ANION GAP: 8 (ref 5–15)
BUN: 10 mg/dL (ref 6–20)
CALCIUM: 8.4 mg/dL — AB (ref 8.9–10.3)
CHLORIDE: 106 mmol/L (ref 101–111)
CO2: 26 mmol/L (ref 22–32)
Creatinine, Ser: 0.58 mg/dL (ref 0.44–1.00)
GFR calc Af Amer: >60 mL/min (ref >60)
GFR calc non Af Amer: >60 mL/min (ref >60)
GLUCOSE: 121 mg/dL — AB (ref 65–99)
Potassium: 3.8 mmol/L (ref 3.5–5.1)
Sodium: 140 mmol/L (ref 135–145)

## 2017-07-07 MED ORDER — BISACODYL 10 MG RE SUPP: 10.0000 mg | Freq: Every day | RECTAL | 0 refills | Status: AC | PRN

## 2017-07-07 MED ORDER — ACETAMINOPHEN 325 MG PO TABS: 650.0000 mg | ORAL_TABLET | Freq: Four times a day (QID) | ORAL | 0 refills | Status: AC | PRN

## 2017-07-07 MED ORDER — ENOXAPARIN SODIUM 40 MG/0.4ML ~~LOC~~ SOLN: 40.0000 mg | SUBCUTANEOUS | 0 refills | Status: AC

## 2017-07-07 MED ORDER — CLONAZEPAM 0.5 MG PO TABS: 0.2500 mg | ORAL_TABLET | Freq: Every evening | ORAL | 0 refills | Status: AC | PRN

## 2017-07-07 MED ORDER — HYDROCODONE-ACETAMINOPHEN 5-325 MG PO TABS: 1.0000 | ORAL_TABLET | ORAL | 0 refills | Status: DC | PRN

## 2017-07-07 MED ORDER — DOCUSATE SODIUM 100 MG PO CAPS: 100.0000 mg | ORAL_CAPSULE | Freq: Two times a day (BID) | ORAL | 0 refills | Status: DC

## 2017-07-07 MED ORDER — TRAZODONE HCL 50 MG PO TABS: 50.0000 mg | ORAL_TABLET | Freq: Every day | ORAL | 0 refills | Status: DC

## 2017-07-07 NOTE — Progress Notes (Signed)
Physical Therapy Treatment Patient Details Name: Donna Moss MRN: [id number] DOB: 1941/06/10 Today's Date: 07/07/2017    History of Present Illness Pt is a 76 yo female, admitted to acute care with L hip fx on 8/8, which was surgically repaired on 8/9. Currently post-op day 1. Prior to admission, pt required assist, [facility]. Pt was independent with amb, using no AD. PMH: anxiety, dementia, depression, and HLD.     PT Comments    Pt is pleasantly confused and better able to follow 1-step commands this session. Pt performs bed mobility and transfers with MinA +2, and amb with ModA +2, due to impaired strength, endurance, balance, sequencing, and pain. Pt able to amb total of 3 ft, primarily limited by pain and confusion from hx of dementia. Overall, pt responded well to today's treatment with no adverse affects, and is progressing towards functional goals.. Pt would benefit from skilled PT to address the previously mentioned impairments and promote return to PLOF. Currently recommending SNF, pending d/c.   Follow Up Recommendations  SNF     Equipment Recommendations  None recommended by PT    Recommendations for Other Services       Precautions / Restrictions Precautions Precautions: Anterior Hip Precaution Comments: Per pt daughter: MD advised against pt crossing legs.  Restrictions Weight Bearing Restrictions: Yes Other Position/Activity Restrictions: L LE WBAT    Mobility  Bed Mobility Overal bed mobility: Needs Assistance Bed Mobility: Sit to Supine     Supine to sit: Min assist Sit to supine: Min assist;+2 for physical assistance   General bed mobility comments: Pt requirs minA +2 for safety, as she is highly impulsive. Pt following 1-step commands better this session.   Transfers Overall transfer level: Needs assistance Equipment used: 2 person hand held assist Transfers: Sit to/from Stand Sit to Stand: Min assist;+2 physical  assistance Stand pivot transfers: +2 physical assistance;Mod assist       General transfer comment: Pt requires minA +2 +2 for safety, primarily due to impulsivity and impaired safety awareness. Pt able to follow 1-step commands more consistently than prior session. Follows rhythmic and tactile cues best.   Ambulation/Gait Ambulation/Gait assistance: Mod assist;+2 physical assistance Ambulation Distance (Feet): 3 Feet Assistive device: 2 person hand held assist Gait Pattern/deviations: Shuffle;Decreased step length - right;Decreased step length - left Gait velocity: decreased   General Gait Details: Pt able to amb 3 ft with ModA +2, requiring Max cues. Pt with poor safety awareness and slight fear of amb due to increased levels of pain with amb. Required increased time to perform task.   Stairs            Wheelchair Mobility    Modified Rankin (Stroke Patients Only)       Balance Overall balance assessment: Needs assistance Sitting-balance support: Bilateral upper extremity supported;Feet supported Sitting balance-Leahy Scale: Fair Sitting balance - Comments: Pt requires CGA with sitting balance, primarily due to impulsivity and pain. Pt with notable increase in posterior sway, but able to self correct with tactile cues.    Standing balance support: Bilateral upper extremity supported Standing balance-Leahy Scale: Poor Standing balance comment: Pt with poor standing balance with B UE HHA, with tendency to present with generally flexed posture with post sway. Pt iunable to follow cues to correct posture for improved balance.  Cognition Arousal/Alertness: Awake/alert Behavior During Therapy: Impulsive;Restless Overall Cognitive Status: History of cognitive impairments - at baseline                                 General Comments: Pt restless and highly impulsive. Pt fidgeting and rubbing clothes and operative site  throughout session. Pt attempting to pull on IV. Pt unable to verbalize concerns. Primarily follows rhythmic verbal cues and tactile cueing.       Exercises      General Comments        Pertinent Vitals/Pain Pain Assessment: Faces Faces Pain Scale: Hurts a little bit Pain Location: L hip  Pain Descriptors / Indicators: Operative site guarding;Grimacing Pain Intervention(s): Limited activity within patient's tolerance;Monitored during session    Home Living Family/patient expects to be discharged to:: Other (Comment) [facility])   Available Help at Discharge: Available 24 hours/day Type of Home: Other(Comment) (Memory care unit) Home Access: Level entry   Home Layout: One level        Prior Function Level of Independence: Needs assistance    ADL's / Homemaking Assistance Needed: Pt. resides at the memory care unit at [facility]. Required assist with ADLs/IADLs. Walked without a walker. Comments: Pt daughter present to provide pt hx, as pt is with dementia and poor historian. Needs assist with all ALD's and IADL's at baseline, but independent with amb, using no AD   PT Goals (current goals can now be found in the care plan section) Acute Rehab PT Goals Patient Stated Goal: Pt. unable to state PT Goal Formulation: With patient/family Time For Goal Achievement: 07/21/17 Potential to Achieve Goals: Good Additional Goals Additional Goal #1: Pt to participate in supine L hip abd x5 reps for greater strength and return to PLOF.  Progress towards PT goals: Progressing toward goals    Frequency    BID      PT Plan Current plan remains appropriate    Co-evaluation              AM-PAC PT "6 Clicks" Daily Activity  Outcome Measure  Difficulty turning over in bed (including adjusting bedclothes, sheets and blankets)?: Total Difficulty moving from lying on back to sitting on the side of the bed? : Total Difficulty sitting down on and standing up  from a chair with arms (e.g., wheelchair, bedside commode, etc,.)?: Total Help needed moving to and from a bed to chair (including a wheelchair)?: A Lot Help needed walking in hospital room?: A Lot Help needed climbing 3-5 steps with a railing? : Total 6 Click Score: 8    End of Session Equipment Utilized During Treatment: Gait belt Activity Tolerance: Patient tolerated treatment well Patient left: in bed;with call bell/phone within reach;with bed alarm set;with family/visitor present;with SCD's reapplied Nurse Communication: Mobility status PT Visit Diagnosis: Unsteadiness on feet (R26.81);Other abnormalities of gait and mobility (R26.89);History of falling (Z91.81);Muscle weakness (generalized) (M62.81);Pain Pain - Right/Left: Left Pain - part of body: Hip     Time: 4098-1191 PT Time Calculation (min) (ACUTE ONLY): 15 min  Charges:                       G Codes:      Sharman Cheek PT, SPT  Latanya Maudlin 07/07/2017, 2:49 PM

## 2017-07-07 NOTE — Care Management Note (Signed)
Case Management Note  Patient Details  Name: Margurite AuerbachKarin E Lebo MRN: [id number] Date of Birth: [birth year]  Subjective/Objective:    PT recommending SNF. CSW aware. Please reconsult if needs arise. Will sign off.                 Action/Plan:   Expected Discharge Date:  07/09/17               Expected Discharge Plan:  Skilled Nursing Facility  In-House Referral:  Clinical Social Work  Discharge planning Services  CM Consult  Post Acute Care Choice:    Choice offered to:     DME Arranged:    DME Agency:     HH Arranged:    HH Agency:     Status of Service:  Completed, signed off  If discussed at [employer] of [employer] Meetings, dates discussed:    Additional Comments:  Marily MemosLisa M Arlander Gillen, RN 07/07/2017, 9:48 AM

## 2017-07-07 NOTE — Evaluation (Signed)
Physical Therapy Evaluation Patient Details Name: Donna AuerbachKarin E Heathcock MRN: [id number] DOB: 06-22-1941 Today's Date: 07/07/2017   History of Present Illness  Pt is a 76 yo female, admitted to acute care with L hip fx on 8/8, which was surgically repaired on 8/9. Currently post-op day 1. Prior to admission, pt required assist[facility]. Pt was independent with amb, using no AD. PMH: anxiety, dementia, depression, and HLD.   Clinical Impression  Pt is pleasantly confused. Pt performs bed mobility with minA and max cues, and tranfers with ModA +2, due to impaired strength, endurance, sequencing, and pain. Pt unable to amb, due to safety concerns.Follows one step commands intermittently, but highly impulsive and fidgeting (pulling clothes and IV) throughout session. Overall, pt responded well to today's treatment with no adverse affects. Pt would benefit from skilled PT to address the previously mentioned impairments and promote return to PLOF.Given significant weakness from baseline and other previously mentioned impairments, currently recommending SNF, pending d/c.      Follow Up Recommendations SNF    Equipment Recommendations  None recommended by PT    Recommendations for Other Services       Precautions / Restrictions Precautions Precautions: Anterior Hip Precaution Comments: Per pt daughter: MD advised against pt crossing legs.  Restrictions Weight Bearing Restrictions: Yes Other Position/Activity Restrictions: L LE WBAT      Mobility  Bed Mobility Overal bed mobility: Needs Assistance Bed Mobility: Supine to Sit     Supine to sit: Min assist     General bed mobility comments: Pt is minA with supine ot sit, though it take increased time, as pt only able to follow 1-step commands inconsistently. Pt impulsive and persistent to sit up at EOB on the R, rather than the left.   Transfers Overall transfer level: Needs assistance   Transfers: Stand  Pivot Transfers   Stand pivot transfers: +2 physical assistance;Mod assist       General transfer comment: Pt ModA +2 for stand pivot transfer. Pt unable to maintain full erect stadning, due to LE weakness, imapired endurance, and impaired ability to follow commands. Pt required ModA +2 for stadn pivot and was motivated to participate through providing pt cues to hug therapist, for better ant weightshift. Pt unable to utilize RW due to cognitive impairments, requires B HHA. Pt on RA, maintaining O2 >90%  Ambulation/Gait             General Gait Details: Defered due to safety concerns.   Stairs            Wheelchair Mobility    Modified Rankin (Stroke Patients Only)       Balance Overall balance assessment: Needs assistance Sitting-balance support: Bilateral upper extremity supported;Feet supported Sitting balance-Leahy Scale: Fair Sitting balance - Comments: Pt requires CGA with sitting balance, primarily due to impulsivity and pain. Pt with notable increase in posterior sway, but able to self correct with tactile cues.    Standing balance support: Bilateral upper extremity supported Standing balance-Leahy Scale: Poor Standing balance comment: Pt with poor standing balance with B UE HHA, with tendency to present with generally flexed posture with post sway. Pt iunable to follow cues to correct posture for improved balance.                              Pertinent Vitals/Pain Pain Assessment: Faces Faces Pain Scale: Hurts a little bit Pain Location: L hip  Pain Descriptors / Indicators: Operative site guarding;Grimacing Pain Intervention(s): Limited activity within patient's tolerance;Monitored during session;Ice applied    Home Living Family/patient expects to be discharged to:: Other (Comment) [facility])   Available Help at Discharge: Available 24 hours/day Type of Home: Other(Comment) (Memory care unit) Home Access: Level entry      Home Layout: One level        Prior Function Level of Independence: Needs assistance      ADL's / Homemaking Assistance Needed: Pt. resides at the memory care unit at [facility]. Required assist with ADLs/IADLs. Walked without a walker.  Comments: Pt daughter present to provide pt hx, as pt is with dementia and poor historian. Needs assist with all ALD's and IADL's at baseline, but independent with amb, using no AD     Hand Dominance   Dominant Hand: Right    Extremity/Trunk Assessment   Upper Extremity Assessment Upper Extremity Assessment: Overall WFL for tasks assessed    Lower Extremity Assessment Lower Extremity Assessment: Generalized weakness (MMT to B LE's grossly 4-/5)       Communication   Communication: Other (comment) (dementia, unabel to fully verbalize concerns. Poor historian)  Cognition Arousal/Alertness: Awake/alert Behavior During Therapy: Impulsive;Restless Overall Cognitive Status: History of cognitive impairments - at baseline                                 General Comments: Pt restless and highly impulsive. Pt fidgeting and rubbing clothes and operative site throughout session. Pt attempting to pull on IV. Pt unable to verbalize concerns. Primarily follows rhythmic verbal cues and tactile cueing.       General Comments      Exercises     Assessment/Plan    PT Assessment Patient needs continued PT services  PT Problem List Decreased strength;Decreased activity tolerance;Decreased balance;Decreased mobility;Decreased coordination;Decreased cognition;Decreased safety awareness;Decreased knowledge of precautions;Pain       PT Treatment Interventions DME instruction;Gait training;Functional mobility training;Therapeutic activities;Therapeutic exercise;Balance training;Neuromuscular re-education;Cognitive remediation;Patient/family education;Manual techniques    PT Goals (Current goals can be found in the Care Plan section)   Acute Rehab PT Goals Patient Stated Goal: Pt. unable to state PT Goal Formulation: With patient/family Time For Goal Achievement: 07/21/17 Potential to Achieve Goals: Good    Frequency BID   Barriers to discharge        Co-evaluation               AM-PAC PT "6 Clicks" Daily Activity  Outcome Measure Difficulty turning over in bed (including adjusting bedclothes, sheets and blankets)?: Total Difficulty moving from lying on back to sitting on the side of the bed? : Total Difficulty sitting down on and standing up from a chair with arms (e.g., wheelchair, bedside commode, etc,.)?: Total Help needed moving to and from a bed to chair (including a wheelchair)?: A Lot Help needed walking in hospital room?: Total Help needed climbing 3-5 steps with a railing? : Total 6 Click Score: 7    End of Session Equipment Utilized During Treatment: Gait belt Activity Tolerance: Patient tolerated treatment well Patient left: in chair;with call bell/phone within reach;with chair alarm set;with family/visitor present;with nursing/sitter in room;with SCD's reapplied Nurse Communication: Mobility status PT Visit Diagnosis: Unsteadiness on feet (R26.81);Other abnormalities of gait and mobility (R26.89);History of falling (Z91.81);Muscle weakness (generalized) (M62.81);Pain Pain - Right/Left: Left Pain - part of body: Hip    Time: 6045-4098 PT Time  Calculation (min) (ACUTE ONLY): 31 min   Charges:         PT G Codes:        Sharman Cheek PT, SPT  Latanya Maudlin 07/07/2017, 1:09 PM

## 2017-07-07 NOTE — Clinical Social Work Note (Signed)
Clinical Social Work Assessment  Patient Details  Name: Donna Moss MRN: [id number] Date of Birth: 10/25/1941  Date of referral:  07/07/17               Reason for consult:  Facility Placement                Permission sought to share information with:  Chartered certified accountant granted to share information::  Yes, Verbal Permission Granted  Name::      [place]::   [place]   Relationship::     Contact Information:     Housing/Transportation Living arrangements for the past 2 months:  [place] of Information:  Adult Children Patient Interpreter Needed:  None Criminal Activity/Legal Involvement Pertinent to Current Situation/Hospitalization:  No - Comment as needed Significant Relationships:  Adult Children Lives with:  Facility Resident Do you feel safe going back to the place where you live?  Yes Need for family participation in patient care:  Yes (Comment)  Care giving concerns:  Patient is a resident at [facility] memory care unit.    Social Worker assessment / plan:  Holiday representative (CSW) received SNF consult. PT is recommending SNF. CSW met with patient and her daughter Shirlean Mylar was at bedside. Per Shirlean Mylar she lives in [place] and her sister Joen Laura lives in [place] and is the main contact. Per Shirlean Mylar patient is a resident at [facility] memory care unit. CSW explained SNF process. Shirlean Mylar is agreeable to SNF search and prefers [facility]. CSW explained that medicare will pay for SNF if patient has a 3 night inpatient qualifying stay at a hospital (patient was admitted to inpatient on 07/05/17). Daughter verbalized her understanding. FL2 complete and faxed out.   CSW met with patient's other daughter Joen Laura at bedside and presented bed offers. Daughter chose [facility]. Per Seth Bake admissions coordinator at [facility] patient can come over the weekend to room 303-B. CSW sent D/C summary to [facility] today. CSW will continue to follow and assist as needed.   Employment status:  Retired, Disabled (Comment on whether or not currently receiving Disability) Insurance information:  Medicare PT Recommendations:  [place] / Referral to community resources:  [place]  Patient/Family's Response to care: Patient's daughters are agreeable for patient to go to [facility].   Patient/Family's Understanding of and Emotional Response to Diagnosis, Current Treatment, and Prognosis: Patient's daughters were very pleasant and thanked CSW for assistance.    Emotional Assessment Appearance:  Appears stated age Attitude/Demeanor/Rapport:  Unable to Assess Affect (typically observed):  Pleasant Orientation:  Oriented to Self, Fluctuating Orientation (Suspected and/or reported Sundowners) Alcohol / Substance use:  Not Applicable Psych involvement (Current and /or in the community):  No (Comment)  Discharge Needs  Concerns to be addressed:  Discharge Planning Concerns Readmission within the last 30 days:  No Current discharge risk:  Dependent with Mobility Barriers to Discharge:  Continued Medical Work up   [employer], Veronia Beets, LCSW 07/07/2017, 3:01 PM

## 2017-07-07 NOTE — Progress Notes (Signed)
[facility] [place] [facility]   PATIENT NAME: Donna Moss    MR#:  [id number]  DATE OF BIRTH:  [birth year]  SUBJECTIVE:  CHIEF COMPLAINT:   Chief Complaint  Patient presents with  . Fall  . Hip Pain     Lives in a memory care unit, came after a fall and fracture on her hip. Denies any complaints. As per daughter she frequently runs UTIs. At her baseline she walks without any support as per her daughter.  Status post surgery, tolerated well. Remained sleepy last evening after surgery but awake today. Does not have any complaints.  REVIEW OF SYSTEMS:   Because of dementia patient is not able to give a review of system. ROS  DRUG ALLERGIES:   Allergies  Allergen Reactions  . Penicillins     VITALS:  Blood pressure 124/66, pulse 71, temperature 98.9 F (37.2 C), temperature source Oral, resp. rate 18, height 5\' 4"  (1.626 m), weight 51.1 kg (112 lb 9.6 oz), SpO2 92 %.  PHYSICAL EXAMINATION:  GENERAL:  76 y.o.-year-old patient lying in the bed with no acute distress.  EYES: Pupils equal, round, reactive to light and accommodation. No scleral icterus. Extraocular muscles intact.  HEENT: Head atraumatic, normocephalic. Oropharynx and nasopharynx clear.  NECK:  Supple, no jugular venous distention. No thyroid enlargement, no tenderness.  LUNGS: Normal breath sounds bilaterally, no wheezing, rales,rhonchi or crepitation. No use of accessory muscles of respiration.  CARDIOVASCULAR: S1, S2 normal. No murmurs, rubs, or gallops.  ABDOMEN: Soft, nontender, nondistended. Bowel sounds present. No organomegaly or mass.  EXTREMITIES: No pedal edema, cyanosis, or clubbing.  NEUROLOGIC: Cranial nerves II through XII are intact. Muscle strength 4/5 in Both upper extremities, lower extremities did not more due to recent surgery. Sensation intact. Gait not checked.  PSYCHIATRIC: The patient is alert and oriented x 1.  SKIN: No obvious rash, lesion, or ulcer.   Physical  Exam LABORATORY PANEL:   CBC  Recent Labs Lab 07/07/17 0343  WBC 10.4  HGB 11.7*  HCT 34.3*  PLT 174   ------------------------------------------------------------------------------------------------------------------  Chemistries   Recent Labs Lab 07/06/17 1144 07/07/17 0343  NA 139 140  K 3.7 3.8  CL 105 106  CO2 28 26  GLUCOSE 113* 121*  BUN 11 10  CREATININE 0.64 0.58  CALCIUM 8.8* 8.4*  MG 1.8  --    ------------------------------------------------------------------------------------------------------------------  Cardiac Enzymes  Recent Labs Lab 07/05/17 2235  TROPONINI <0.03   ------------------------------------------------------------------------------------------------------------------  RADIOLOGY:  Dg Chest 1 View  Result Date: 07/05/2017 CLINICAL DATA:  Status post fall, with concern for chest injury. Initial encounter. EXAM: CHEST 1 VIEW COMPARISON:  None. FINDINGS: The lungs are well-aerated. Mild left basilar airspace opacity may reflect atelectasis or possibly mild infection. There is no evidence of pleural effusion or pneumothorax. The cardiomediastinal silhouette is borderline normal in size. No acute osseous abnormalities are seen. Scattered vascular calcifications are seen. IMPRESSION: Mild left basilar airspace opacity may reflect atelectasis or possibly mild infection. No displaced rib fracture seen. Electronically Signed   By: Roanna RaiderJeffery  Chang M.D.   On: 07/05/2017 22:34   Dg Hip Operative Unilat W Or W/o Pelvis Left  Result Date: 07/06/2017 CLINICAL DATA:  Left hip arthroplasty. EXAM: OPERATIVE LEFT HIP (WITH PELVIS IF PERFORMED) 1 VIEW TECHNIQUE: Fluoroscopic spot image(s) were submitted for interpretation post-operatively. COMPARISON:  None. FINDINGS: Intraoperative image demonstrates the proximal portion of a bipolar hemiarthroplasty of the left hip. The femoral stem is not completely visualized. Alignment appears  normal at the level of the hip.  IMPRESSION: Normal alignment of visualized left hip bipolar hemiarthroplasty. Electronically Signed   By: Irish Lack M.D.   On: 07/06/2017 16:18   Dg Hip Unilat W Or W/o Pelvis 2-3 Views Left  Result Date: 07/06/2017 CLINICAL DATA:  Post hip replacement.  Postop pain. EXAM: DG HIP (WITH OR WITHOUT PELVIS) 2-3V LEFT COMPARISON:  07/05/2017 FINDINGS: Interval left hemiarthroplasty. No periprosthetic fracture or other acute complication identified. Vascular calcifications. A right pelvic calcification is nonspecific. IMPRESSION: Expected appearance after left hemiarthroplasty. Electronically Signed   By: Jeronimo Greaves M.D.   On: 07/06/2017 17:02   Dg Hip Unilat W Or Wo Pelvis 2-3 Views Left  Result Date: 07/05/2017 CLINICAL DATA:  Status post fall, with left hip pain. Initial encounter. EXAM: DG HIP (WITH OR WITHOUT PELVIS) 2-3V LEFT COMPARISON:  None. FINDINGS: There is a mildly displaced subcapital fracture through the left femoral neck. Both femoral heads are seated normally within their respective acetabula. No significant degenerative change is appreciated. The sacroiliac joints are unremarkable in appearance. Calcifications are noted at the right lower quadrant. The visualized bowel gas pattern is grossly unremarkable in appearance. IMPRESSION: Mildly displaced subcapital fracture through the left femoral neck. Electronically Signed   By: Roanna Raider M.D.   On: 07/05/2017 22:34    ASSESSMENT AND PLAN:   Principal Problem:   Closed left hip fracture (HCC) Active Problems:   HLD (hyperlipidemia)   Dementia   * Left hip Fracture   S/p hemiarthroplasty on left hip- 07/06/17  * Fall   As per daughter the patient has gradual worsening in her strength and going down hill in her feeding and strength.    UA is negative, TSH - not high.  * HLD (hyperlipidemia) - continue home meds  * Dementia - continue home meds  * Hypokalemia- replaced.  All the records are reviewed and case discussed  with ED provider. Management plans discussed with the patient and/or family.  DVT PROPHYLAXIS: Mechanical only  GI PROPHYLAXIS: None   All the records are reviewed and case discussed with Care Management/Social Workerr. Management plans discussed with the patient, family and they are in agreement.  CODE STATUS: full.  TOTAL TIME TAKING CARE OF THIS PATIENT: 35 minutes.     POSSIBLE D/C IN 1-2 DAYS, DEPENDING ON CLINICAL CONDITION.   Altamese Dilling M.D on 07/07/2017   Between 7am to 6pm - Pager - [phone]  After 6pm go to [email or web address] - password [email or web address]  [facility]  Office  [phone]  CC: Primary care physician; Patient, No Pcp Per  Note: This dictation was prepared with Dragon dictation along with smaller phrase technology. Any transcriptional errors that result from this process are unintentional.

## 2017-07-07 NOTE — NC FL2 (Signed)
[place] MEDICAID FL2 LEVEL OF CARE SCREENING TOOL     IDENTIFICATION  Patient Name: Donna Moss Birthdate: 19-Jan-1941 Sex: female Admission Date (Current Location): 07/05/2017  [place] and [place] Number:  Chiropodist and Address:  [facility], [address], [place], [place] [address]      Provider Number: [id number]  Attending Physician Name and Address:  Altamese Dilling, *  Relative Name and Phone Number:       Current Level of Care: Hospital Recommended Level of Care: Skilled Nursing Facility Prior Approval Number:    Date Approved/Denied:   PASRR Number:  [id number])  Discharge Plan: SNF    Current Diagnoses: Patient Active Problem List   Diagnosis Date Noted  . Closed left hip fracture (HCC) 07/05/2017  . HLD (hyperlipidemia) 07/05/2017  . Dementia 07/05/2017    Orientation RESPIRATION BLADDER Height & Weight     Self  Normal Incontinent Weight: 112 lb 9.6 oz (51.1 kg) Height:  5\' 4"  (162.6 cm)  BEHAVIORAL SYMPTOMS/MOOD NEUROLOGICAL BOWEL NUTRITION STATUS   (none)  (none) Incontinent Diet (Diet: Clear Liquid to be Advanced. )  AMBULATORY STATUS COMMUNICATION OF NEEDS Skin   Extensive Assist Verbally Surgical wounds (Incision: Left Hip. )                       Personal Care Assistance Level of Assistance  Bathing, Feeding, Dressing Bathing Assistance: Limited assistance Feeding assistance: Independent Dressing Assistance: Limited assistance     Functional Limitations Info  Sight, Hearing, Speech Sight Info: Adequate Hearing Info: Adequate Speech Info: Adequate    SPECIAL CARE FACTORS FREQUENCY  PT (By licensed PT), OT (By licensed OT)     PT Frequency:  (5) OT Frequency:  (5)            Contractures      Additional Factors Info  Code Status, Allergies Code Status Info:  (DNR ) Allergies Info:  (Penicillins. )           Current Medications (07/07/2017):  This is the current  hospital active medication list Current Facility-Administered Medications  Medication Dose Route Frequency Provider Last Rate Last Dose  . 0.9 %  sodium chloride infusion   Intravenous Continuous Kennedy Bucker, MD 100 mL/hr at 07/07/17 0401    . acetaminophen (TYLENOL) tablet 650 mg  650 mg Oral Q6H PRN Oralia Manis, MD       Or  . acetaminophen (TYLENOL) suppository 650 mg  650 mg Rectal Q6H PRN Oralia Manis, MD      . acetaminophen (TYLENOL) tablet 1,000 mg  1,000 mg Oral Q6H Kennedy Bucker, MD   1,000 mg at 07/07/17 1148  . alum & mag hydroxide-simeth (MAALOX/MYLANTA) 200-200-20 MG/5ML suspension 30 mL  30 mL Oral Q4H PRN Kennedy Bucker, MD      . bisacodyl (DULCOLAX) suppository 10 mg  10 mg Rectal Daily PRN Kennedy Bucker, MD      . diphenhydrAMINE (BENADRYL) 12.5 MG/5ML elixir 12.5-25 mg  12.5-25 mg Oral Q4H PRN Kennedy Bucker, MD      . docusate sodium (COLACE) capsule 100 mg  100 mg Oral BID Kennedy Bucker, MD   100 mg at 07/07/17 1000  . donepezil (ARICEPT) tablet 10 mg  10 mg Oral QHS Oralia Manis, MD      . enoxaparin (LOVENOX) injection 40 mg  40 mg Subcutaneous Q24H Kennedy Bucker, MD   40 mg at 07/07/17 0800  . HYDROcodone-acetaminophen (NORCO/VICODIN) 5-325 MG per  tablet 1-2 tablet  1-2 tablet Oral Q4H PRN Kennedy BuckerMenz, Michael, MD      . magnesium citrate solution 1 Bottle  1 Bottle Oral Once PRN Kennedy BuckerMenz, Michael, MD      . magnesium hydroxide (MILK OF MAGNESIA) suspension 30 mL  30 mL Oral Daily PRN Kennedy BuckerMenz, Michael, MD      . menthol-cetylpyridinium (CEPACOL) lozenge 3 mg  1 lozenge Oral PRN Kennedy BuckerMenz, Michael, MD       Or  . phenol (CHLORASEPTIC) mouth spray 1 spray  1 spray Mouth/Throat PRN Kennedy BuckerMenz, Michael, MD      . morphine 2 MG/ML injection 1 mg  1 mg Intravenous Q2H PRN Kennedy BuckerMenz, Michael, MD      . ondansetron [facility](ZOFRAN) tablet 4 mg  4 mg Oral Q6H PRN Oralia ManisWillis, David, MD       Or  . ondansetron [facility](ZOFRAN) injection 4 mg  4 mg Intravenous Q6H PRN Oralia ManisWillis, David, MD      . oxyCODONE (Oxy IR/ROXICODONE)  immediate release tablet 5 mg  5 mg Oral Q4H PRN Oralia ManisWillis, David, MD      . PARoxetine (PAXIL) tablet 30 mg  30 mg Oral Daily Oralia ManisWillis, David, MD   30 mg at 07/07/17 1000  . potassium chloride (KLOR-CON) packet 80 mEq  80 mEq Oral Once Kennedy BuckerMenz, Michael, MD      . potassium chloride 10 mEq in 100 mL IVPB  10 mEq Intravenous Q1H PRN Kennedy BuckerMenz, Michael, MD 100 mL/hr at 07/06/17 1247 10 mEq at 07/06/17 1247  . pravastatin (PRAVACHOL) tablet 20 mg  20 mg Oral q1800 Oralia ManisWillis, David, MD         Discharge Medications: Please see discharge summary for a list of discharge medications.  Relevant Imaging Results:  Relevant Lab Results:   Additional Information  (SSN: [id number])  Sample, Darleen CrockerBailey M, LCSW

## 2017-07-07 NOTE — Progress Notes (Signed)
  Subjective: 1 Day Post-Op Procedure(s) (LRB): ARTHROPLASTY BIPOLAR HIP (HEMIARTHROPLASTY) (Left) Patient reports pain as mild.   Patient seen in rounds with Dr. Rosita KeaMenz. Patient is well, and has had no acute complaints or problems Plan is to go Skilled nursing facility after hospital stay. Negative for chest pain and shortness of breath Fever: no Gastrointestinal: Negative for nausea and vomiting  Objective: Vital signs in last 24 hours: Temp:  [97.5 F (36.4 C)-98.3 F (36.8 C)] 97.8 F (36.6 C) (08/10 0425) Pulse Rate:  [62-142] 63 (08/10 0425) Resp:  [16-25] 16 (08/10 0425) BP: (114-199)/(54-91) 154/61 (08/10 0425) SpO2:  [82 %-100 %] 99 % (08/10 0425)  Intake/Output from previous day:  Intake/Output Summary (Last 24 hours) at 07/07/17 0620 Last data filed at 07/07/17 0500  Gross per 24 hour  Intake          2156.67 ml  Output             1350 ml  Net           806.67 ml    Intake/Output this shift: Total I/O In: 916.7 [I.V.:916.7] Out: 650 [Urine:650]  Labs:  Recent Labs  07/05/17 2235 07/06/17 0250 07/07/17 0343  HGB 13.3 13.1 11.7*    Recent Labs  07/06/17 0250 07/07/17 0343  WBC 7.2 10.4  RBC 4.28 3.85  HCT 38.9 34.3*  PLT 173 174    Recent Labs  07/06/17 1144 07/07/17 0343  NA 139 140  K 3.7 3.8  CL 105 106  CO2 28 26  BUN 11 10  CREATININE 0.64 0.58  GLUCOSE 113* 121*  CALCIUM 8.8* 8.4*    Recent Labs  07/05/17 2235  INR 0.94     EXAM General - Patient is Confused and Resting Extremity - Intact pulses distally No cellulitis present Compartment soft Dressing/Incision - clean, dry, no drainage Motor Function - intact, moving foot and toes well on exam.   Past Medical History:  Diagnosis Date  . Anxiety   . Dementia   . Depression   . HLD (hyperlipidemia)     Assessment/Plan: 1 Day Post-Op Procedure(s) (LRB): ARTHROPLASTY BIPOLAR HIP (HEMIARTHROPLASTY) (Left) Principal Problem:   Closed left hip fracture  (HCC) Active Problems:   HLD (hyperlipidemia)   Dementia  Estimated body mass index is 19.33 kg/m as calculated from the following:   Height as of this encounter: 5\' 4"  (1.626 m).   Weight as of this encounter: 51.1 kg (112 lb 9.6 oz). Advance diet Up with therapy D/C IV fluids  DVT Prophylaxis - Lovenox, Foot Pumps and TED hose Weight-Bearing as tolerated to left leg  Dedra Skeensodd Lindbergh Winkles, PA-C Orthopaedic Surgery 07/07/2017, 6:20 AM

## 2017-07-07 NOTE — Discharge Summary (Signed)
[facility] -  at [facility]   PATIENT NAME: Donna Moss    MR#:  [id number]  DATE OF BIRTH:  1941-08-18  DATE OF ADMISSION:  07/05/2017 ADMITTING PHYSICIAN: Oralia Manis, MD  DATE OF DISCHARGE:   PRIMARY CARE PHYSICIAN: Patient, No Pcp Per    ADMISSION DIAGNOSIS:  Fall, initial encounter [W19.XXXA] Closed subcapital fracture of femur, left, initial encounter (HCC) [S72.012A] Dementia without behavioral disturbance, unspecified dementia type [F03.90]  DISCHARGE DIAGNOSIS:  Principal Problem:   Closed left hip fracture (HCC) Active Problems:   HLD (hyperlipidemia)   Dementia   S/p hemiarthroplasty of hip.  SECONDARY DIAGNOSIS:   Past Medical History:  Diagnosis Date  . Anxiety   . Dementia   . Depression   . HLD (hyperlipidemia)     HOSPITAL COURSE:   * Left hip Fracture   s/p left hip hemiarthroplasty 07/06/17.   need rehab after sx.  * Fall   As per daughter the patient has gradual worsening in her strength and going down hill in her feeding and strength. Checked UA and TSH, no clear source.   Likely progression of her dementia.  * HLD (hyperlipidemia) - continue home meds *Dementia - continue home meds   * hypokalemia   Replaced.  DISCHARGE CONDITIONS:   Stable.  CONSULTS OBTAINED:  Treatment Team:  Kennedy Bucker, MD  DRUG ALLERGIES:   Allergies  Allergen Reactions  . Penicillins     DISCHARGE MEDICATIONS:   Current Discharge Medication List    START taking these medications   Details  acetaminophen (TYLENOL) 325 MG tablet Take 2 tablets (650 mg total) by mouth every 6 (six) hours as needed for mild pain (or Fever >/= 101). Qty: 20 tablet, Refills: 0    bisacodyl (DULCOLAX) 10 MG suppository Place 1 suppository (10 mg total) rectally daily as needed for moderate constipation. Qty: 12 suppository, Refills: 0    docusate sodium (COLACE) 100 MG capsule Take 1 capsule (100 mg total) by mouth 2 (two)  times daily. Qty: 10 capsule, Refills: 0    enoxaparin (LOVENOX) 40 MG/0.4ML injection Inject 0.4 mLs (40 mg total) into the skin daily. Qty: 12 Syringe, Refills: 0    HYDROcodone-acetaminophen (NORCO/VICODIN) 5-325 MG tablet Take 1 tablet by mouth every 4 (four) hours as needed (breakthrough pain). Qty: 30 tablet, Refills: 0      CONTINUE these medications which have CHANGED   Details  clonazePAM (KLONOPIN) 0.5 MG tablet Take 0.5 tablets (0.25 mg total) by mouth at bedtime as needed for anxiety. Qty: 20 tablet, Refills: 0    traZODone (DESYREL) 50 MG tablet Take 1 tablet (50 mg total) by mouth at bedtime. Qty: 20 tablet, Refills: 0      CONTINUE these medications which have NOT CHANGED   Details  Cranberry 425 MG CAPS Take 1 capsule by mouth 2 (two) times daily.    diphenoxylate-atropine (LOMOTIL) 2.5-0.025 MG tablet Take 1 tablet by mouth 4 (four) times daily as needed for diarrhea or loose stools.    donepezil (ARICEPT) 10 MG tablet Take 10 mg by mouth at bedtime.    lovastatin (MEVACOR) 20 MG tablet Take 20 mg by mouth at bedtime.    Melatonin 5 MG TABS Take 1 tablet by mouth at bedtime.    omega-3 acid ethyl esters (LOVAZA) 1 g capsule Take 1 g by mouth daily.    PARoxetine (PAXIL) 30 MG tablet Take 30 mg by mouth daily.    QUEtiapine (SEROQUEL) 50 MG tablet  Take 50 mg by mouth at bedtime.    vitamin B-12 (CYANOCOBALAMIN) 1000 MCG tablet Take 1,000 mcg by mouth See admin instructions. Mondays, Wednesday, Fridays         DISCHARGE INSTRUCTIONS:    Follow with orthopedics clinic in 1-2 weeks.  If you experience worsening of your admission symptoms, develop shortness of breath, life threatening emergency, suicidal or homicidal thoughts you must seek medical attention immediately by calling 911 or calling your MD immediately  if symptoms less severe.  You Must read complete instructions/literature along with all the possible adverse reactions/side effects for all  the Medicines you take and that have been prescribed to you. Take any new Medicines after you have completely understood and accept all the possible adverse reactions/side effects.   Please note  You were cared for by a hospitalist during your hospital stay. If you have any questions about your discharge medications or the care you received while you were in the hospital after you are discharged, you can call the unit and asked to speak with the hospitalist on call if the hospitalist that took care of you is not available. Once you are discharged, your primary care physician will handle any further medical issues. Please note that NO REFILLS for any discharge medications will be authorized once you are discharged, as it is imperative that you return to your primary care physician (or establish a relationship with a primary care physician if you do not have one) for your aftercare needs so that they can reassess your need for medications and monitor your lab values.    Today   CHIEF COMPLAINT:   Chief Complaint  Patient presents with  . Fall  . Hip Pain    HISTORY OF PRESENT ILLNESS:  Donna Moss  is a 76 y.o. female presents with Unwitnessed fall and subsequent left hip pain. Here in the ED on imaging she was found have a left hip fracture. Orthopedic surgery contacted and plans for likely operative repair tomorrow. Hospitalist called for admission.  VITAL SIGNS:  Blood pressure 124/66, pulse 71, temperature 98.9 F (37.2 C), temperature source Oral, resp. rate 18, height 5\' 4"  (1.626 m), weight 51.1 kg (112 lb 9.6 oz), SpO2 92 %.  I/O:   Intake/Output Summary (Last 24 hours) at 07/07/17 1351 Last data filed at 07/07/17 0630  Gross per 24 hour  Intake          1956.67 ml  Output             1225 ml  Net           731.67 ml    PHYSICAL EXAMINATION:   GENERAL:  76 y.o.-year-old patient lying in the bed with no acute distress.  EYES: Pupils equal, round, reactive to light and  accommodation. No scleral icterus. Extraocular muscles intact.  HEENT: Head atraumatic, normocephalic. Oropharynx and nasopharynx clear.  NECK:  Supple, no jugular venous distention. No thyroid enlargement, no tenderness.  LUNGS: Normal breath sounds bilaterally, no wheezing, rales,rhonchi or crepitation. No use of accessory muscles of respiration.  CARDIOVASCULAR: S1, S2 normal. No murmurs, rubs, or gallops.  ABDOMEN: Soft, nontender, nondistended. Bowel sounds present. No organomegaly or mass.  EXTREMITIES: No pedal edema, cyanosis, or clubbing.  NEUROLOGIC: Cranial nerves II through XII are intact. Muscle strength 4/5 in Both upper extremities, lower extremities 3/5. Sensation intact. Gait not checked.  PSYCHIATRIC: The patient is alert and oriented x 1.  SKIN: No obvious rash, lesion, or ulcer.  DATA REVIEW:   CBC  Recent Labs Lab 07/07/17 0343  WBC 10.4  HGB 11.7*  HCT 34.3*  PLT 174    Chemistries   Recent Labs Lab 07/06/17 1144 07/07/17 0343  NA 139 140  K 3.7 3.8  CL 105 106  CO2 28 26  GLUCOSE 113* 121*  BUN 11 10  CREATININE 0.64 0.58  CALCIUM 8.8* 8.4*  MG 1.8  --     Cardiac Enzymes  Recent Labs Lab 07/05/17 2235  TROPONINI <0.03    Microbiology Results  Results for orders placed or performed during the hospital encounter of 07/05/17  MRSA PCR Screening     Status: None   Collection Time: 07/06/17  1:43 AM  Result Value Ref Range Status   MRSA by PCR NEGATIVE NEGATIVE Final    Comment:        The GeneXpert MRSA Assay (FDA approved for NASAL specimens only), is one component of a comprehensive MRSA colonization surveillance program. It is not intended to diagnose MRSA infection nor to guide or monitor treatment for MRSA infections.     RADIOLOGY:  Dg Chest 1 View  Result Date: 07/05/2017 CLINICAL DATA:  Status post fall, with concern for chest injury. Initial encounter. EXAM: CHEST 1 VIEW COMPARISON:  None. FINDINGS: The lungs are  well-aerated. Mild left basilar airspace opacity may reflect atelectasis or possibly mild infection. There is no evidence of pleural effusion or pneumothorax. The cardiomediastinal silhouette is borderline normal in size. No acute osseous abnormalities are seen. Scattered vascular calcifications are seen. IMPRESSION: Mild left basilar airspace opacity may reflect atelectasis or possibly mild infection. No displaced rib fracture seen. Electronically Signed   By: Roanna Raider M.D.   On: 07/05/2017 22:34   Dg Hip Operative Unilat W Or W/o Pelvis Left  Result Date: 07/06/2017 CLINICAL DATA:  Left hip arthroplasty. EXAM: OPERATIVE LEFT HIP (WITH PELVIS IF PERFORMED) 1 VIEW TECHNIQUE: Fluoroscopic spot image(s) were submitted for interpretation post-operatively. COMPARISON:  None. FINDINGS: Intraoperative image demonstrates the proximal portion of a bipolar hemiarthroplasty of the left hip. The femoral stem is not completely visualized. Alignment appears normal at the level of the hip. IMPRESSION: Normal alignment of visualized left hip bipolar hemiarthroplasty. Electronically Signed   By: Irish Lack M.D.   On: 07/06/2017 16:18   Dg Hip Unilat W Or W/o Pelvis 2-3 Views Left  Result Date: 07/06/2017 CLINICAL DATA:  Post hip replacement.  Postop pain. EXAM: DG HIP (WITH OR WITHOUT PELVIS) 2-3V LEFT COMPARISON:  07/05/2017 FINDINGS: Interval left hemiarthroplasty. No periprosthetic fracture or other acute complication identified. Vascular calcifications. A right pelvic calcification is nonspecific. IMPRESSION: Expected appearance after left hemiarthroplasty. Electronically Signed   By: Jeronimo Greaves M.D.   On: 07/06/2017 17:02   Dg Hip Unilat W Or Wo Pelvis 2-3 Views Left  Result Date: 07/05/2017 CLINICAL DATA:  Status post fall, with left hip pain. Initial encounter. EXAM: DG HIP (WITH OR WITHOUT PELVIS) 2-3V LEFT COMPARISON:  None. FINDINGS: There is a mildly displaced subcapital fracture through the left  femoral neck. Both femoral heads are seated normally within their respective acetabula. No significant degenerative change is appreciated. The sacroiliac joints are unremarkable in appearance. Calcifications are noted at the right lower quadrant. The visualized bowel gas pattern is grossly unremarkable in appearance. IMPRESSION: Mildly displaced subcapital fracture through the left femoral neck. Electronically Signed   By: Roanna Raider M.D.   On: 07/05/2017 22:34    EKG:   Orders placed or  performed during the hospital encounter of 07/05/17  . EKG 12-Lead  . EKG 12-Lead      Management plans discussed with the patient, family and they are in agreement.  CODE STATUS:     Code Status Orders        Start     Ordered   07/06/17 1746  Do not attempt resuscitation (DNR)  Continuous    Question Answer Comment  In the event of cardiac or respiratory ARREST Do not call a "code blue"   In the event of cardiac or respiratory ARREST Do not perform Intubation, CPR, defibrillation or ACLS   In the event of cardiac or respiratory ARREST Use medication by any route, position, wound care, and other measures to relive pain and suffering. May use oxygen, suction and manual treatment of airway obstruction as needed for comfort.      07/06/17 1745    Code Status History    Date Active Date Inactive Code Status Order ID Comments User Context   07/06/2017 12:02 AM 07/06/2017  5:45 PM Full Code [id number]  Oralia ManisWillis, David, MD ED    Advance Directive Documentation     Most Recent Value  Type of Advance Directive  Out of facility DNR (pink MOST or yellow form), Healthcare Power of Attorney  Pre-existing out of facility DNR order (yellow form or pink MOST form)  Yellow form placed in chart (order not valid for inpatient use)  "MOST" Form in Place?  -      TOTAL TIME TAKING CARE OF THIS PATIENT: 35 minutes.    Altamese DillingVACHHANI, Calaya Gildner M.D on 07/07/2017 at 1:51 PM  Between 7am to 6pm - Pager -  [phone]  After 6pm go to [email or web address] - password [email or web address]  [facility]  Office  [phone]  CC: Primary care physician; Patient, No Pcp Per   Note: This dictation was prepared with Dragon dictation along with smaller phrase technology. Any transcriptional errors that result from this process are unintentional.

## 2017-07-07 NOTE — Evaluation (Addendum)
Occupational Therapy Evaluation Patient Details Name: Donna Moss MRN: [id number] DOB: 05-29-1941 Today's Date: 07/07/2017    History of Present Illness Pt. is a 76 y.o. female who was admitted to [facility] for Hemiarthroplasty repair of a Left Displaced Femoral Neck Fracture. Pt. PMHX includes: Dementia, Anxiety, Depression, HLD, and frequent UTIs.   Clinical Impression   Pt. Is a 76 y.o. Female who was admitted to [facility] for Hemiarthroplasty repair of a Left nondisplaced Femoral Neck fracture. Pt. has cognitive impairments at baseline, and resides at the [facility] unit. Pt. Was sitting up in chair upon arrival. Pt's daughter was present, and feeding pt. Upon arrival. Pt. with poor intake, and has a liquid diet. Pt. fidgets with her hands. Pt. presents with weakness, pain, cognitive impairments, and limited mobility. Pt. could benefit from OT services for pt. family training, and cognitive compensatory strategies during basic ADL care. Pt. Would benefit from SNF level of care upon discharge.    Follow Up Recommendations  SNF    Equipment Recommendations       Recommendations for Other Services       Precautions / Restrictions Precautions Precautions: Anterior Hip Restrictions Weight Bearing Restrictions: Yes (Left WBAT)                                                    ADL either performed or assessed with clinical judgement   ADL Overall ADL's : Needs assistance/impaired Eating/Feeding: Total assistance   Grooming: Total assistance   Upper Body Bathing: Total assistance   Lower Body Bathing: Total assistance       Lower Body Dressing: Total assistance       Toileting: Total assistance       Functional mobility during ADLs:  (TBA)       Vision Patient Visual Report: No change from baseline       Perception     Praxis      Pertinent Vitals/Pain       Hand Dominance Right   Extremity/Trunk Assessment Upper  Extremity Assessment Upper Extremity Assessment: Difficult to assess due to impaired cognition           Communication Communication Communication: No difficulties   Cognition Arousal/Alertness: Awake/alert Behavior During Therapy: Impulsive;Restless (Moving hands, fidgeting with her hands. Pt. under constanst supervision by her daughter to prevent pt. from pulling IVs out.) Overall Cognitive Status: History of cognitive impairments - at baseline                                     General Comments       Exercises     Shoulder Instructions      Home Living Family/patient expects to be discharged to:: Skilled nursing facility                                        Prior Functioning/Environment Level of Independence: Needs assistance    ADL's / Homemaking Assistance Needed: Pt. resides at the memory care unit at [facility]. Required assist with ADLs/IADLs. Walked without a walker.            OT Problem List: Decreased strength;Decreased knowledge of  use of DME or AE;Decreased safety awareness;Decreased cognition;Decreased activity tolerance;Impaired UE functional use;Pain      OT Treatment/Interventions: Self-care/ADL training;Patient/family education    OT Goals(Current goals can be found in the care plan section) Acute Rehab OT Goals Patient Stated Goal: Pt. unable to state  OT Frequency: Min 1X/week   Barriers to D/C:            Co-evaluation              AM-PAC PT "6 Clicks" Daily Activity     Outcome Measure Help from another person eating meals?: Total Help from another person taking care of personal grooming?: Total Help from another person toileting, which includes using toliet, bedpan, or urinal?: Total Help from another person bathing (including washing, rinsing, drying)?: Total Help from another person to put on and taking off regular upper body clothing?: Total Help from another person to put on and taking off  regular lower body clothing?: Total 6 Click Score: 6   End of Session    Activity Tolerance: Patient limited by pain (Limited by cogntion) Patient left: in chair;with call bell/phone within reach;with chair alarm set;with family/visitor present  OT Visit Diagnosis: History of falling (Z91.81);Muscle weakness (generalized) (M62.81)                Time: [id number] OT Time Calculation (min): 14 min Charges:  OT General Charges $OT Visit: 1 Procedure OT Evaluation $OT Eval Low Complexity: 1 Procedure G-Codes: OT G-codes **NOT FOR INPATIENT CLASS** Functional Assessment Tool Used: Clinical judgement Functional Limitation: Self care Self Care Current Status (B2841(G8987): At least 80 percent but less than 100 percent impaired, limited or restricted Self Care Goal Status (L2440(G8988): At least 40 percent but less than 60 percent impaired, limited or restricted   Donna MessierElaine Amaree Leeper, MS, OTR/L   Donna MessierElaine Jedrick Hutcherson, MS, OTR/L 07/07/2017, 12:02 PM

## 2017-07-07 NOTE — Clinical Social Work Placement (Signed)
   CLINICAL SOCIAL WORK PLACEMENT  NOTE  Date:  07/07/2017  Patient Details  Name: Donna Moss MRN: [id number] Date of Birth: December 20, 1940  Clinical Social Work is seeking post-discharge placement for this patient at the Skilled  Nursing Facility level of care (*CSW will initial, date and re-position this form in  chart as items are completed):  Yes   Patient/family provided with [facility] Clinical Social Work Department's list of facilities offering this level of care within the geographic area requested by the patient (or if unable, by the patient's family).  Yes   Patient/family informed of their freedom to choose among providers that offer the needed level of care, that participate in Medicare, Medicaid or managed care program needed by the patient, have an available bed and are willing to accept the patient.  Yes   Patient/family informed of [facility]'s ownership interest in [facility] and [facility], as well as of the fact that they are under no obligation to receive care at these facilities.  PASRR submitted to EDS on 07/07/17     PASRR number received on 07/07/17     Existing PASRR number confirmed on       FL2 transmitted to all facilities in geographic area requested by pt/family on 07/07/17     FL2 transmitted to all facilities within larger geographic area on       Patient informed that his/her managed care company has contracts with or will negotiate with certain facilities, including the following:        Yes   Patient/family informed of bed offers received.  Patient chooses bed at  [facility] )     Physician recommends and patient chooses bed at      Patient to be transferred to   on  .  Patient to be transferred to facility by       Patient family notified on   of transfer.  Name of family member notified:        PHYSICIAN       Additional Comment:    _______________________________________________ Donna Moss, Donna CrockerBailey M,  LCSW 07/07/2017, 2:51 PM

## 2017-07-08 LAB — CBC
HCT: 33.8 % — ABNORMAL LOW (ref 35.0–47.0)
HEMOGLOBIN: 11.7 g/dL — AB (ref 12.0–16.0)
MCH: 30.6 pg (ref 26.0–34.0)
MCHC: 34.7 g/dL (ref 32.0–36.0)
MCV: 88.4 fL (ref 80.0–100.0)
PLATELETS: 160 10*3/uL (ref 150–440)
RBC: 3.83 MIL/uL (ref 3.80–5.20)
RDW: 13.2 % (ref 11.5–14.5)
WBC: 7.6 10*3/uL (ref 3.6–11.0)

## 2017-07-08 MED ORDER — POLYETHYLENE GLYCOL 3350 17 G PO PACK
17.0000 g | PACK | Freq: Every day | ORAL | Status: DC
  Administered 2017-07-08: 17 g via ORAL
  Filled 2017-07-08: qty 1

## 2017-07-08 NOTE — Progress Notes (Signed)
  Subjective: 2 Days Post-Op Procedure(s) (LRB): ARTHROPLASTY BIPOLAR HIP (HEMIARTHROPLASTY) (Left) Patient reports pain as mild.   Patient seen in rounds with Dr. Joice LoftsPoggi. Patient is well, and has had no acute complaints or problems Plan is to go Skilled nursing facility after hospital stay. Negative for chest pain and shortness of breath Fever: no Gastrointestinal: Negative for nausea and vomiting  Objective: Vital signs in last 24 hours: Temp:  [98.2 F (36.8 C)-98.9 F (37.2 C)] 98.9 F (37.2 C) (08/11 0016) Pulse Rate:  [64-95] 80 (08/11 0016) Resp:  [17-18] 17 (08/11 0016) BP: (117-157)/(60-91) 143/91 (08/11 0016) SpO2:  [91 %-97 %] 91 % (08/11 0016)  Intake/Output from previous day:  Intake/Output Summary (Last 24 hours) at 07/08/17 0650 Last data filed at 07/08/17 0137  Gross per 24 hour  Intake          1858.33 ml  Output                0 ml  Net          1858.33 ml    Intake/Output this shift: No intake/output data recorded.  Labs:  Recent Labs  07/05/17 2235 07/06/17 0250 07/07/17 0343 07/08/17 0418  HGB 13.3 13.1 11.7* 11.7*    Recent Labs  07/07/17 0343 07/08/17 0418  WBC 10.4 7.6  RBC 3.85 3.83  HCT 34.3* 33.8*  PLT 174 160    Recent Labs  07/06/17 1144 07/07/17 0343  NA 139 140  K 3.7 3.8  CL 105 106  CO2 28 26  BUN 11 10  CREATININE 0.64 0.58  GLUCOSE 113* 121*  CALCIUM 8.8* 8.4*    Recent Labs  07/05/17 2235  INR 0.94     EXAM General - Patient is Confused and Resting Extremity - Intact pulses distally No cellulitis present Compartment soft  Right shoulder pain and grimacing with using for pushing up. Full range of motion. Nontender in the proximal humerus and clavicle. Dressing/Incision - clean, dry, Scant drainage Motor Function - intact, moving foot and toes well on exam. Ambulated 3 feet.  Past Medical History:  Diagnosis Date  . Anxiety   . Dementia   . Depression   . HLD (hyperlipidemia)      Assessment/Plan: 2 Days Post-Op Procedure(s) (LRB): ARTHROPLASTY BIPOLAR HIP (HEMIARTHROPLASTY) (Left) Principal Problem:   Closed left hip fracture (HCC) Active Problems:   HLD (hyperlipidemia)   Dementia  Estimated body mass index is 19.33 kg/m as calculated from the following:   Height as of this encounter: 5\' 4"  (1.626 m).   Weight as of this encounter: 51.1 kg (112 lb 9.6 oz). Advance diet Up with therapy D/C IV fluids  Discharge plan the skilled nursing possible today, depending on medicine.  DVT Prophylaxis - Lovenox, Foot Pumps and TED hose Weight-Bearing as tolerated to left leg  Dedra Skeensodd Analie Katzman, PA-C Orthopaedic Surgery 07/08/2017, 6:50 AM

## 2017-07-08 NOTE — Clinical Social Work Note (Signed)
[facility] will discharge to [facility] today. Medical necessity form is on the chart with the packet should the family prefer ambulance transport. CSW attempted to contact the patient's daughter; however, she was unavailable as [facility] is [facility]. The facility is aware and in agreement. CSW will continue to follow pending additional discharge needs.  Argentina PonderKaren Martha Graham Hyun, MSW, Theresia MajorsLCSWA [phone]

## 2017-07-08 NOTE — Discharge Instructions (Signed)
ANTERIOR APPROACH TOTAL HIP REPLACEMENT POSTOPERATIVE DIRECTIONS ° ° °Hip Rehabilitation, Guidelines Following Surgery  °The results of a hip operation are greatly improved after range of motion and muscle strengthening exercises. Follow all safety measures which are given to protect your hip. If any of these exercises cause increased pain or swelling in your joint, decrease the amount until you are comfortable again. Then slowly increase the exercises. Call your caregiver if you have problems or questions.  ° °HOME CARE INSTRUCTIONS  °Remove items at home which could result in a fall. This includes throw rugs or furniture in walking pathways.  °· ICE to the affected hip every three hours for 30 minutes at a time and then as needed for pain and swelling.  Continue to use ice on the hip for pain and swelling from surgery. You may notice swelling that will progress down to the foot and ankle.  This is normal after surgery.  Elevate the leg when you are not up walking on it.   °· Continue to use the breathing machine which will help keep your temperature down.  It is common for your temperature to cycle up and down following surgery, especially at night when you are not up moving around and exerting yourself.  The breathing machine keeps your lungs expanded and your temperature down. °· Do not place pillow under knee, focus on keeping the knee straight while resting ° °DIET °You may resume your previous home diet once your are discharged from the hospital. ° °DRESSING / WOUND CARE / SHOWERING °Keep the surgical dressing until follow up.  The dressing is water proof, so you can shower without any extra covering.  IF THE DRESSING FALLS OFF or the wound gets wet inside, change the dressing with sterile gauze.  Please use good hand washing techniques before changing the dressing.  Do not use any lotions or creams on the incision until instructed by your surgeon.   °Keep your dressing dry with showering.  You can keep it  covered and pat dry. °Change the surgical dressing daily and reapply a dry dressing each time. ° °ACTIVITY °Walk with your walker as instructed. °Use walker as long as suggested by your caregivers. °Avoid periods of inactivity such as sitting longer than an hour when not asleep. This helps prevent blood clots.  °You may resume a sexual relationship in one month or when given the OK by your doctor.  °You may return to work once you are cleared by your doctor.  °Do not drive a car for 6 weeks or until released by you surgeon.  °Do not drive while taking narcotics. ° °WEIGHT BEARING °Weight bearing as tolerated with assist device (walker, cane, etc) as directed, use it as long as suggested by your surgeon or therapist, typically at least 4-6 weeks. ° °POSTOPERATIVE CONSTIPATION PROTOCOL °Constipation - defined medically as fewer than three stools per week and severe constipation as less than one stool per week. ° °One of the most common issues patients have following surgery is constipation.  Even if you have a regular bowel pattern at home, your normal regimen is likely to be disrupted due to multiple reasons following surgery.  Combination of anesthesia, postoperative narcotics, change in appetite and fluid intake all can affect your bowels.  In order to avoid complications following surgery, here are some recommendations in order to help you during your recovery period. ° °Colace (docusate) - Pick up an over-the-counter form of Colace or another stool softener and take twice   a day as long as you are requiring postoperative pain medications.  Take with a full glass of water daily.  If you experience loose stools or diarrhea, hold the colace until you stool forms back up.  If your symptoms do not get better within 1 week or if they get worse, check with your doctor. ° °Dulcolax (bisacodyl) - Pick up over-the-counter and take as directed by the product packaging as needed to assist with the movement of your bowels.   Take with a full glass of water.  Use this product as needed if not relieved by Colace only.  ° °MiraLax (polyethylene glycol) - Pick up over-the-counter to have on hand.  MiraLax is a solution that will increase the amount of water in your bowels to assist with bowel movements.  Take as directed and can mix with a glass of water, juice, soda, coffee, or tea.  Take if you go more than two days without a movement. °Do not use MiraLax more than once per day. Call your doctor if you are still constipated or irregular after using this medication for 7 days in a row. ° °If you continue to have problems with postoperative constipation, please contact the office for further assistance and recommendations.  If you experience "the worst abdominal pain ever" or develop nausea or vomiting, please contact the office immediatly for further recommendations for treatment. ° °ITCHING ° If you experience itching with your medications, try taking only a single pain pill, or even half a pain pill at a time.  You can also use Benadryl over the counter for itching or also to help with sleep.  ° °TED HOSE STOCKINGS °Wear the elastic stockings on both legs for three weeks following surgery during the day but you may remove then at night for sleeping. ° °MEDICATIONS °See your medication summary on the “After Visit Summary” that the nursing staff will review with you prior to discharge.  You may have some home medications which will be placed on hold until you complete the course of blood thinner medication.  It is important for you to complete the blood thinner medication as prescribed by your surgeon.  Continue your approved medications as instructed at time of discharge. ° °PRECAUTIONS °If you experience chest pain or shortness of breath - call 911 immediately for transfer to the hospital emergency department.  °If you develop a fever greater that 101 F, purulent drainage from wound, increased redness or drainage from wound, foul odor  from the wound/dressing, or calf pain - CONTACT YOUR SURGEON.   °                                                °FOLLOW-UP APPOINTMENTS °Make sure you keep all of your appointments after your operation with your surgeon and caregivers. You should call the office at the above phone number and make an appointment for approximately two weeks after the date of your surgery or on the date instructed by your surgeon outlined in the "After Visit Summary". ° °RANGE OF MOTION AND STRENGTHENING EXERCISES  °These exercises are designed to help you keep full movement of your hip joint. Follow your caregiver's or physical therapist's instructions. Perform all exercises about fifteen times, three times per day or as directed. Exercise both hips, even if you have had only one joint replacement. These exercises can be   done on a training (exercise) mat, on the floor, on a table or on a bed. Use whatever works the best and is most comfortable for you. Use music or television while you are exercising so that the exercises are a pleasant break in your day. This will make your life better with the exercises acting as a break in routine you can look forward to.  °Lying on your back, slowly slide your foot toward your buttocks, raising your knee up off the floor. Then slowly slide your foot back down until your leg is straight again.  °Lying on your back spread your legs as far apart as you can without causing discomfort.  °Lying on your side, raise your upper leg and foot straight up from the floor as far as is comfortable. Slowly lower the leg and repeat.  °Lying on your back, tighten up the muscle in the front of your thigh (quadriceps muscles). You can do this by keeping your leg straight and trying to raise your heel off the floor. This helps strengthen the largest muscle supporting your knee.  °Lying on your back, tighten up the muscles of your buttocks both with the legs straight and with the knee bent at a comfortable angle while  keeping your heel on the floor.  ° °IF YOU ARE TRANSFERRED TO A SKILLED REHAB FACILITY °If the patient is transferred to a skilled rehab facility following release from the hospital, a list of the current medications will be sent to the facility for the patient to continue.  When discharged from the skilled rehab facility, please have the facility set up the patient's Home Health Physical Therapy prior to being released. Also, the skilled facility will be responsible for providing the patient with their medications at time of release from the facility to include their pain medication, the muscle relaxants, and their blood thinner medication. If the patient is still at the rehab facility at time of the two week follow up appointment, the skilled rehab facility will also need to assist the patient in arranging follow up appointment in our office and any transportation needs. ° °MAKE SURE YOU:  °Understand these instructions.  °Get help right away if you are not doing well or get worse.  ° ° °Pick up stool softner and laxative for home use following surgery while on pain medications. °Do not submerge incision under water. °Please use good hand washing techniques while changing dressing each day. °May shower starting three days after surgery. °Please use a clean towel to pat the incision dry following showers. °Continue to use ice for pain and swelling after surgery. °Do not use any lotions or creams on the incision until instructed by your surgeon. ° °

## 2017-07-08 NOTE — Progress Notes (Signed)
Report called to [facility]. EMS called. Daughter at bedside. Waiting on transport.

## 2017-07-08 NOTE — Discharge Summary (Signed)
[facility] [place] at [facility]   PATIENT NAME: Donna Moss    MR#:  [id number]  DATE OF BIRTH:  Jan 24, 1941  DATE OF ADMISSION:  07/05/2017 ADMITTING PHYSICIAN: Oralia Manis, MD  DATE OF DISCHARGE: 07/08/2017   PRIMARY CARE PHYSICIAN: Patient, No Pcp Per    ADMISSION DIAGNOSIS:  Fall, initial encounter [W19.XXXA] Closed subcapital fracture of femur, left, initial encounter (HCC) [S72.012A] Dementia without behavioral disturbance, unspecified dementia type [F03.90]  DISCHARGE DIAGNOSIS:  Principal Problem:   Closed left hip fracture (HCC) Active Problems:   HLD (hyperlipidemia)   Dementia   S/p hemiarthroplasty of hip.  SECONDARY DIAGNOSIS:   Past Medical History:  Diagnosis Date  . Anxiety   . Dementia   . Depression   . HLD (hyperlipidemia)     HOSPITAL COURSE:   * Left hip Fracture   s/p left hip hemiarthroplasty 07/06/17.   need rehab after sx.   Lovenox [place] and pain management and follow in ortho clinic in 1-2 weeks.  * Fall   As per daughter the patient has gradual worsening in her strength and going down hill in her feeding and strength. Checked UA and TSH, no clear source.   Likely progression of her dementia.  * HLD (hyperlipidemia) - continue home meds *Dementia - continue home meds   * hypokalemia   Replaced.  DISCHARGE CONDITIONS:   Stable.  CONSULTS OBTAINED:  Treatment Team:  Kennedy Bucker, MD  DRUG ALLERGIES:   Allergies  Allergen Reactions  . Penicillins     DISCHARGE MEDICATIONS:   Current Discharge Medication List    START taking these medications   Details  acetaminophen (TYLENOL) 325 MG tablet Take 2 tablets (650 mg total) by mouth every 6 (six) hours as needed for mild pain (or Fever >/= 101). Qty: 20 tablet, Refills: 0    bisacodyl (DULCOLAX) 10 MG suppository Place 1 suppository (10 mg total) rectally daily as needed for moderate constipation. Qty: 12 suppository, Refills: 0     docusate sodium (COLACE) 100 MG capsule Take 1 capsule (100 mg total) by mouth 2 (two) times daily. Qty: 10 capsule, Refills: 0    enoxaparin (LOVENOX) 40 MG/0.4ML injection Inject 0.4 mLs (40 mg total) into the skin daily. Qty: 12 Syringe, Refills: 0    HYDROcodone-acetaminophen (NORCO/VICODIN) 5-325 MG tablet Take 1 tablet by mouth every 4 (four) hours as needed (breakthrough pain). Qty: 30 tablet, Refills: 0      CONTINUE these medications which have CHANGED   Details  clonazePAM (KLONOPIN) 0.5 MG tablet Take 0.5 tablets (0.25 mg total) by mouth at bedtime as needed for anxiety. Qty: 20 tablet, Refills: 0    traZODone (DESYREL) 50 MG tablet Take 1 tablet (50 mg total) by mouth at bedtime. Qty: 20 tablet, Refills: 0      CONTINUE these medications which have NOT CHANGED   Details  Cranberry 425 MG CAPS Take 1 capsule by mouth 2 (two) times daily.    diphenoxylate-atropine (LOMOTIL) 2.5-0.025 MG tablet Take 1 tablet by mouth 4 (four) times daily as needed for diarrhea or loose stools.    donepezil (ARICEPT) 10 MG tablet Take 10 mg by mouth at bedtime.    lovastatin (MEVACOR) 20 MG tablet Take 20 mg by mouth at bedtime.    Melatonin 5 MG TABS Take 1 tablet by mouth at bedtime.    omega-3 acid ethyl esters (LOVAZA) 1 g capsule Take 1 g by mouth daily.    PARoxetine (PAXIL)  30 MG tablet Take 30 mg by mouth daily.    QUEtiapine (SEROQUEL) 50 MG tablet Take 50 mg by mouth at bedtime.    vitamin B-12 (CYANOCOBALAMIN) 1000 MCG tablet Take 1,000 mcg by mouth See admin instructions. Mondays, Wednesday, Fridays         DISCHARGE INSTRUCTIONS:    Follow with orthopedics clinic in 1-2 weeks.  If you experience worsening of your admission symptoms, develop shortness of breath, life threatening emergency, suicidal or homicidal thoughts you must seek medical attention immediately by calling 911 or calling your MD immediately  if symptoms less severe.  You Must read complete  instructions/literature along with all the possible adverse reactions/side effects for all the Medicines you take and that have been prescribed to you. Take any new Medicines after you have completely understood and accept all the possible adverse reactions/side effects.   Please note  You were cared for by a hospitalist during your hospital stay. If you have any questions about your discharge medications or the care you received while you were in the hospital after you are discharged, you can call the unit and asked to speak with the hospitalist on call if the hospitalist that took care of you is not available. Once you are discharged, your primary care physician will handle any further medical issues. Please note that NO REFILLS for any discharge medications will be authorized once you are discharged, as it is imperative that you return to your primary care physician (or establish a relationship with a primary care physician if you do not have one) for your aftercare needs so that they can reassess your need for medications and monitor your lab values.    Today   CHIEF COMPLAINT:   Chief Complaint  Patient presents with  . Fall  . Hip Pain    HISTORY OF PRESENT ILLNESS:  Donna Moss  is a 76 y.o. female presents with Unwitnessed fall and subsequent left hip pain. Here in the ED on imaging she was found have a left hip fracture. Orthopedic surgery contacted and plans for likely operative repair tomorrow. Hospitalist called for admission.  VITAL SIGNS:  Blood pressure (!) 156/78, pulse 80, temperature 98.2 F (36.8 C), temperature source Oral, resp. rate 18, height 5\' 4"  (1.626 m), weight 51.1 kg (112 lb 9.6 oz), SpO2 93 %.  I/O:    Intake/Output Summary (Last 24 hours) at 07/08/17 0903 Last data filed at 07/08/17 0137  Gross per 24 hour  Intake          1858.33 ml  Output                0 ml  Net          1858.33 ml    PHYSICAL EXAMINATION:   GENERAL:  76 y.o.-year-old  patient lying in the bed with no acute distress.  EYES: Pupils equal, round, reactive to light and accommodation. No scleral icterus. Extraocular muscles intact.  HEENT: Head atraumatic, normocephalic. Oropharynx and nasopharynx clear.  NECK:  Supple, no jugular venous distention. No thyroid enlargement, no tenderness.  LUNGS: Normal breath sounds bilaterally, no wheezing, rales,rhonchi or crepitation. No use of accessory muscles of respiration.  CARDIOVASCULAR: S1, S2 normal. No murmurs, rubs, or gallops.  ABDOMEN: Soft, nontender, nondistended. Bowel sounds present. No organomegaly or mass.  EXTREMITIES: No pedal edema, cyanosis, or clubbing.  NEUROLOGIC: Cranial nerves II through XII are intact. Muscle strength 4/5 in Both upper extremities, lower extremities 3/5. Sensation intact. Gait not checked.  PSYCHIATRIC: The patient is alert and oriented x 1.  SKIN: No obvious rash, lesion, or ulcer.    DATA REVIEW:   CBC  Recent Labs Lab 07/08/17 0418  WBC 7.6  HGB 11.7*  HCT 33.8*  PLT 160    Chemistries   Recent Labs Lab 07/06/17 1144 07/07/17 0343  NA 139 140  K 3.7 3.8  CL 105 106  CO2 28 26  GLUCOSE 113* 121*  BUN 11 10  CREATININE 0.64 0.58  CALCIUM 8.8* 8.4*  MG 1.8  --     Cardiac Enzymes  Recent Labs Lab 07/05/17 2235  TROPONINI <0.03    Microbiology Results  Results for orders placed or performed during the hospital encounter of 07/05/17  MRSA PCR Screening     Status: None   Collection Time: 07/06/17  1:43 AM  Result Value Ref Range Status   MRSA by PCR NEGATIVE NEGATIVE Final    Comment:        The GeneXpert MRSA Assay (FDA approved for NASAL specimens only), is one component of a comprehensive MRSA colonization surveillance program. It is not intended to diagnose MRSA infection nor to guide or monitor treatment for MRSA infections.     RADIOLOGY:  Dg Hip Operative Unilat W Or W/o Pelvis Left  Result Date: 07/06/2017 CLINICAL DATA:  Left  hip arthroplasty. EXAM: OPERATIVE LEFT HIP (WITH PELVIS IF PERFORMED) 1 VIEW TECHNIQUE: Fluoroscopic spot image(s) were submitted for interpretation post-operatively. COMPARISON:  None. FINDINGS: Intraoperative image demonstrates the proximal portion of a bipolar hemiarthroplasty of the left hip. The femoral stem is not completely visualized. Alignment appears normal at the level of the hip. IMPRESSION: Normal alignment of visualized left hip bipolar hemiarthroplasty. Electronically Signed   By: Irish Lack M.D.   On: 07/06/2017 16:18   Dg Hip Unilat W Or W/o Pelvis 2-3 Views Left  Result Date: 07/06/2017 CLINICAL DATA:  Post hip replacement.  Postop pain. EXAM: DG HIP (WITH OR WITHOUT PELVIS) 2-3V LEFT COMPARISON:  07/05/2017 FINDINGS: Interval left hemiarthroplasty. No periprosthetic fracture or other acute complication identified. Vascular calcifications. A right pelvic calcification is nonspecific. IMPRESSION: Expected appearance after left hemiarthroplasty. Electronically Signed   By: Jeronimo Greaves M.D.   On: 07/06/2017 17:02    EKG:   Orders placed or performed during the hospital encounter of 07/05/17  . EKG 12-Lead  . EKG 12-Lead      Management plans discussed with the patient, family and they are in agreement.  CODE STATUS:     Code Status Orders        Start     Ordered   07/06/17 1746  Do not attempt resuscitation (DNR)  Continuous    Question Answer Comment  In the event of cardiac or respiratory ARREST Do not call a "code blue"   In the event of cardiac or respiratory ARREST Do not perform Intubation, CPR, defibrillation or ACLS   In the event of cardiac or respiratory ARREST Use medication by any route, position, wound care, and other measures to relive pain and suffering. May use oxygen, suction and manual treatment of airway obstruction as needed for comfort.      07/06/17 1745    Code Status History    Date Active Date Inactive Code Status Order ID Comments User  Context   07/06/2017 12:02 AM 07/06/2017  5:45 PM Full Code [id number]  Oralia Manis, MD ED    Advance Directive Documentation     Most Recent Value  Type  of Advance Directive  Out of facility DNR (pink MOST or yellow form), Healthcare Power of Attorney  Pre-existing out of facility DNR order (yellow form or pink MOST form)  Yellow form placed in chart (order not valid for inpatient use)  "MOST" Form in Place?  -      TOTAL TIME TAKING CARE OF THIS PATIENT: 35 minutes.    Altamese DillingVACHHANI, Jakson Delpilar M.D on 07/08/2017 at 9:03 AM  Between 7am to 6pm - Pager - [phone]  After 6pm go to [email or web address] - password [email or web address]  [facility]  Office  [phone]  CC: Primary care physician; Patient, No Pcp Per   Note: This dictation was prepared with Dragon dictation along with smaller phrase technology. Any transcriptional errors that result from this process are unintentional.

## 2017-07-08 NOTE — Progress Notes (Signed)
Dressing changed.

## 2017-07-10 LAB — SURGICAL PATHOLOGY

## 2017-07-11 DIAGNOSIS — G301 Alzheimer's disease with late onset: Secondary | ICD-10-CM | POA: Diagnosis not present

## 2017-07-11 DIAGNOSIS — F39 Unspecified mood [affective] disorder: Secondary | ICD-10-CM | POA: Diagnosis not present

## 2017-07-11 DIAGNOSIS — S7292XA Unspecified fracture of left femur, initial encounter for closed fracture: Secondary | ICD-10-CM

## 2017-07-11 DIAGNOSIS — F05 Delirium due to known physiological condition: Secondary | ICD-10-CM | POA: Diagnosis not present

## 2017-07-14 NOTE — Anesthesia Postprocedure Evaluation (Signed)
Anesthesia Post Note  Patient: Donna Moss  Procedure(s) Performed: Procedure(s) (LRB): ARTHROPLASTY BIPOLAR HIP (HEMIARTHROPLASTY) (Left)  Patient location during evaluation: PACU Anesthesia Type: General Level of consciousness: oriented and awake and alert Pain management: pain level controlled Vital Signs Assessment: post-procedure vital signs reviewed and stable Respiratory status: spontaneous breathing, respiratory function stable and patient connected to nasal cannula oxygen Cardiovascular status: blood pressure returned to baseline and stable Postop Assessment: no headache and no backache Anesthetic complications: no     Last Vitals:  Vitals:   07/08/17 0744 07/08/17 0956  BP: (!) 156/78 (!) 146/62  Pulse:  93  Resp: 18 18  Temp: 36.8 C 37.3 C  SpO2: 93% 94%    Last Pain:  Vitals:   07/08/17 0956  TempSrc: Oral  PainSc:                  Kaylany Tesoriero S

## 2017-08-10 DIAGNOSIS — S72143A Displaced intertrochanteric fracture of unspecified femur, initial encounter for closed fracture: Secondary | ICD-10-CM | POA: Diagnosis not present

## 2017-08-10 DIAGNOSIS — F39 Unspecified mood [affective] disorder: Secondary | ICD-10-CM | POA: Diagnosis not present

## 2017-08-10 DIAGNOSIS — G301 Alzheimer's disease with late onset: Secondary | ICD-10-CM | POA: Diagnosis not present

## 2017-08-10 DIAGNOSIS — M159 Polyosteoarthritis, unspecified: Secondary | ICD-10-CM | POA: Diagnosis not present

## 2017-08-10 DIAGNOSIS — E441 Mild protein-calorie malnutrition: Secondary | ICD-10-CM | POA: Diagnosis not present

## 2017-09-22 DIAGNOSIS — E43 Unspecified severe protein-calorie malnutrition: Secondary | ICD-10-CM | POA: Diagnosis not present

## 2017-09-22 DIAGNOSIS — M1990 Unspecified osteoarthritis, unspecified site: Secondary | ICD-10-CM | POA: Diagnosis not present

## 2017-09-22 DIAGNOSIS — F39 Unspecified mood [affective] disorder: Secondary | ICD-10-CM | POA: Diagnosis not present

## 2017-09-22 DIAGNOSIS — G309 Alzheimer's disease, unspecified: Secondary | ICD-10-CM | POA: Diagnosis not present

## 2017-09-24 ENCOUNTER — Telehealth: Payer: Self-pay | Admitting: Family Medicine

## 2017-09-24 NOTE — Telephone Encounter (Signed)
Received call from team health RN who transferred me to [facility] regarding this patient. The nurse at [facility] reports the patient had an episode earlier today where she was running around and kicking and screaming and trying to bite. The nurse reports that they discontinued her trazodone last week. The patient is currently on Remeron and the nurse thinks this may been started last week. It sounds as though the plan was to see how she does through the weekend off of the trazodone. They note that they have been able to get the patient to calm down with one-on-one attention and that the patient is calm and at her baseline since the episode earlier. She notes no other symptoms. Denies signs of illness. She notes the only thing that has changed is the trazodone. Patient is currently calm and acting appropriately. The nurse reports she is calling just in case this were to recur. The nurse reports that the patient is also on paroxetine and Klonopin once daily. The patient is not on Seroquel. She also reports that the patient has dementia and has done this previously. I advised as long as the patient is not of harm to herself or others they should continue behavioral strategies and to redirect her. Discussed if she becomes a harm to herself or others possibly having her evaluated to ensure no other underlying causes. Certainly should be evaluated by her PCP tomorrow. Advised continuing to try to redirect her and work on behavioral interventions. If they have recurrent issues with this she may call back to discuss further to consider potential medication management.

## 2017-09-25 NOTE — Telephone Encounter (Signed)
PLEASE NOTE: All timestamps contained within this report are represented as [place] Standard Time. CONFIDENTIALTY NOTICE: This fax transmission is intended only for the addressee. It contains information that is legally privileged, confidential or otherwise protected from use or disclosure. If you are not the intended recipient, you are strictly prohibited from reviewing, disclosing, copying using or disseminating any of this information or taking any action in reliance on or regarding this information. If you have received this fax in error, please notify us immediately by telephone so that we can arrange for its return to us. Phone: [phone], Toll-Free: [phone], Fax: [phone] Page: 1 of 1 Call Id: [id number] [facility] Client Nonclinical Telephone Record [facility] Client [facility] - Client [facility] - Night Physician Tillman AbideLetvak, Richard - MD Contact Type Call Who Is Calling Physician / Provider / Hospital Call Type Provider Call [facility] Page Now Reason for Call Request to speak to Physician Initial Comment Caller states [facility] [phone], resident is running up and down the hall, kicking and screaming. Needing to speak to the On Call. Additional Comment Patient Name Donna Moss Patient DOB 1941/10/04 [facility] Number [phone] [facility] Paging DoctorName Phone DateTime Result/Outcome Message Type Notes Marikay AlarSonnenberg, Eric - MD [phone] 09/24/2017 11:48:57 AM Called On Call Provider - Reached Doctor Paged Marikay AlarSonnenberg, Eric - MD 09/24/2017 11:49:02 AM Spoke with On Call - General Message Result Call Closed By: Burt EkAshleigh Brown Transaction Date/Time: 09/24/2017 11:33:37 AM (ET)

## 2017-09-25 NOTE — Telephone Encounter (Signed)
She seems to be back at baseline now--and no recurrent episodes of agitation. She has had periodic trouble with agitation since her admission--will continue to monitor

## 2017-10-10 DIAGNOSIS — G301 Alzheimer's disease with late onset: Secondary | ICD-10-CM | POA: Diagnosis not present

## 2017-10-10 DIAGNOSIS — M159 Polyosteoarthritis, unspecified: Secondary | ICD-10-CM | POA: Diagnosis not present

## 2017-10-10 DIAGNOSIS — E441 Mild protein-calorie malnutrition: Secondary | ICD-10-CM

## 2017-10-10 DIAGNOSIS — F39 Unspecified mood [affective] disorder: Secondary | ICD-10-CM

## 2017-11-12 ENCOUNTER — Emergency Department: Payer: Medicare Other

## 2017-11-12 ENCOUNTER — Inpatient Hospital Stay
Admission: EM | Admit: 2017-11-12 | Discharge: 2017-11-15 | DRG: 640 | Disposition: A | Discharge status: Skilled Nursing Facility | Payer: Medicare Other | Attending: Specialist | Admitting: Specialist

## 2017-11-12 ENCOUNTER — Other Ambulatory Visit: Payer: Self-pay

## 2017-11-12 ENCOUNTER — Telehealth: Payer: Self-pay | Admitting: Family Medicine

## 2017-11-12 DIAGNOSIS — B961 Klebsiella pneumoniae [K. pneumoniae] as the cause of diseases classified elsewhere: Secondary | ICD-10-CM | POA: Diagnosis present

## 2017-11-12 DIAGNOSIS — F0390 Unspecified dementia without behavioral disturbance: Secondary | ICD-10-CM | POA: Diagnosis present

## 2017-11-12 DIAGNOSIS — E43 Unspecified severe protein-calorie malnutrition: Secondary | ICD-10-CM | POA: Diagnosis present

## 2017-11-12 DIAGNOSIS — Z681 Body mass index (BMI) 19 or less, adult: Secondary | ICD-10-CM | POA: Diagnosis not present

## 2017-11-12 DIAGNOSIS — F419 Anxiety disorder, unspecified: Secondary | ICD-10-CM | POA: Diagnosis present

## 2017-11-12 DIAGNOSIS — E785 Hyperlipidemia, unspecified: Secondary | ICD-10-CM | POA: Diagnosis present

## 2017-11-12 DIAGNOSIS — Z66 Do not resuscitate: Secondary | ICD-10-CM | POA: Diagnosis present

## 2017-11-12 DIAGNOSIS — I10 Essential (primary) hypertension: Secondary | ICD-10-CM | POA: Diagnosis present

## 2017-11-12 DIAGNOSIS — Z7901 Long term (current) use of anticoagulants: Secondary | ICD-10-CM

## 2017-11-12 DIAGNOSIS — Z880 Allergy status to penicillin: Secondary | ICD-10-CM

## 2017-11-12 DIAGNOSIS — L89151 Pressure ulcer of sacral region, stage 1: Secondary | ICD-10-CM | POA: Diagnosis present

## 2017-11-12 DIAGNOSIS — E870 Hyperosmolality and hypernatremia: Secondary | ICD-10-CM | POA: Diagnosis present

## 2017-11-12 DIAGNOSIS — E860 Dehydration: Secondary | ICD-10-CM | POA: Diagnosis present

## 2017-11-12 DIAGNOSIS — Z96642 Presence of left artificial hip joint: Secondary | ICD-10-CM | POA: Diagnosis present

## 2017-11-12 DIAGNOSIS — L8990 Pressure ulcer of unspecified site, unspecified stage: Secondary | ICD-10-CM

## 2017-11-12 DIAGNOSIS — Z87891 Personal history of nicotine dependence: Secondary | ICD-10-CM | POA: Diagnosis not present

## 2017-11-12 DIAGNOSIS — G9340 Encephalopathy, unspecified: Secondary | ICD-10-CM

## 2017-11-12 DIAGNOSIS — N390 Urinary tract infection, site not specified: Secondary | ICD-10-CM | POA: Diagnosis present

## 2017-11-12 DIAGNOSIS — F329 Major depressive disorder, single episode, unspecified: Secondary | ICD-10-CM | POA: Diagnosis present

## 2017-11-12 DIAGNOSIS — G9341 Metabolic encephalopathy: Secondary | ICD-10-CM | POA: Diagnosis present

## 2017-11-12 DIAGNOSIS — N3000 Acute cystitis without hematuria: Secondary | ICD-10-CM | POA: Diagnosis present

## 2017-11-12 DIAGNOSIS — N179 Acute kidney failure, unspecified: Secondary | ICD-10-CM | POA: Diagnosis present

## 2017-11-12 DIAGNOSIS — R4182 Altered mental status, unspecified: Secondary | ICD-10-CM | POA: Diagnosis not present

## 2017-11-12 LAB — CBC WITH DIFFERENTIAL/PLATELET
BASOS ABS: 0.1 10*3/uL (ref 0–0.1)
Basophils Relative: 1 %
EOS ABS: 0 10*3/uL (ref 0–0.7)
EOS PCT: 0 %
HCT: 47.6 % — ABNORMAL HIGH (ref 35.0–47.0)
HEMOGLOBIN: 15.5 g/dL (ref 12.0–16.0)
LYMPHS PCT: 14 %
Lymphs Abs: 2.3 10*3/uL (ref 1.0–3.6)
MCH: 31.2 pg (ref 26.0–34.0)
MCHC: 32.6 g/dL (ref 32.0–36.0)
MCV: 95.8 fL (ref 80.0–100.0)
Monocytes Absolute: 1.2 10*3/uL — ABNORMAL HIGH (ref 0.2–0.9)
Monocytes Relative: 7 %
NEUTROS PCT: 78 %
Neutro Abs: 13.3 10*3/uL — ABNORMAL HIGH (ref 1.4–6.5)
PLATELETS: 308 10*3/uL (ref 150–440)
RBC: 4.97 MIL/uL (ref 3.80–5.20)
RDW: 13.2 % (ref 11.5–14.5)
WBC: 17 10*3/uL — AB (ref 3.6–11.0)

## 2017-11-12 LAB — URINALYSIS, COMPLETE (UACMP) WITH MICROSCOPIC
Bilirubin Urine: NEGATIVE
Glucose, UA: NEGATIVE mg/dL
Ketones, ur: NEGATIVE mg/dL
Leukocytes, UA: NEGATIVE
NITRITE: NEGATIVE
PROTEIN: 30 mg/dL — AB
Specific Gravity, Urine: 1.026 (ref 1.005–1.030)
pH: 5 (ref 5.0–8.0)

## 2017-11-12 LAB — COMPREHENSIVE METABOLIC PANEL
ALT: 126 U/L — AB (ref 14–54)
AST: 61 U/L — ABNORMAL HIGH (ref 15–41)
Albumin: 4.3 g/dL (ref 3.5–5.0)
Alkaline Phosphatase: 57 U/L (ref 38–126)
Anion gap: 13 (ref 5–15)
BUN: 70 mg/dL — AB (ref 6–20)
CHLORIDE: 124 mmol/L — AB (ref 101–111)
CO2: 23 mmol/L (ref 22–32)
CREATININE: 1.27 mg/dL — AB (ref 0.44–1.00)
Calcium: 9.9 mg/dL (ref 8.9–10.3)
GFR calc Af Amer: 46 mL/min — ABNORMAL LOW (ref >60)
GFR calc non Af Amer: 40 mL/min — ABNORMAL LOW (ref >60)
Glucose, Bld: 146 mg/dL — ABNORMAL HIGH (ref 65–99)
Potassium: 3.4 mmol/L — ABNORMAL LOW (ref 3.5–5.1)
SODIUM: 160 mmol/L — AB (ref 135–145)
Total Bilirubin: 1.9 mg/dL — ABNORMAL HIGH (ref 0.3–1.2)
Total Protein: 8.1 g/dL (ref 6.5–8.1)

## 2017-11-12 LAB — SODIUM: Sodium: 161 mmol/L (ref 135–145)

## 2017-11-12 LAB — TROPONIN I: Troponin I: 0.03 ng/mL (ref <0.03)

## 2017-11-12 MED ORDER — CEFTRIAXONE SODIUM 2 G IJ SOLR
2.0000 g | INTRAMUSCULAR | Status: DC
  Administered 2017-11-13 – 2017-11-14 (×2): 2 g via INTRAVENOUS
  Filled 2017-11-12 (×3): qty 2

## 2017-11-12 MED ORDER — CLONAZEPAM 0.125 MG PO TBDP
0.1250 mg | ORAL_TABLET | Freq: Every morning | ORAL | Status: DC
  Administered 2017-11-13: 09:00:00 0.125 mg via ORAL
  Filled 2017-11-12: qty 1

## 2017-11-12 MED ORDER — PAROXETINE HCL 20 MG PO TABS
20.0000 mg | ORAL_TABLET | Freq: Every day | ORAL | Status: DC
  Administered 2017-11-13 – 2017-11-15 (×3): 20 mg via ORAL
  Filled 2017-11-12 (×3): qty 1

## 2017-11-12 MED ORDER — ENOXAPARIN SODIUM 30 MG/0.3ML ~~LOC~~ SOLN
30.0000 mg | SUBCUTANEOUS | Status: DC
  Administered 2017-11-13 – 2017-11-14 (×2): 30 mg via SUBCUTANEOUS
  Filled 2017-11-12 (×2): qty 0.3

## 2017-11-12 MED ORDER — DEXTROSE 5 % IV SOLN
2.0000 g | Freq: Once | INTRAVENOUS | Status: AC
  Administered 2017-11-12: 2 g via INTRAVENOUS
  Filled 2017-11-12: qty 2

## 2017-11-12 MED ORDER — MIRTAZAPINE 15 MG PO TABS
7.5000 mg | ORAL_TABLET | Freq: Every day | ORAL | Status: DC
  Filled 2017-11-12: qty 1

## 2017-11-12 MED ORDER — SODIUM CHLORIDE 0.9 % IV SOLN: Freq: Once | INTRAVENOUS | Status: DC

## 2017-11-12 MED ORDER — ONDANSETRON HCL 4 MG/2ML IJ SOLN: 4.0000 mg | Freq: Four times a day (QID) | INTRAMUSCULAR | Status: DC | PRN

## 2017-11-12 MED ORDER — ONDANSETRON HCL 4 MG PO TABS: 4.0000 mg | ORAL_TABLET | Freq: Four times a day (QID) | ORAL | Status: DC | PRN

## 2017-11-12 MED ORDER — ACETAMINOPHEN 650 MG RE SUPP: 650.0000 mg | Freq: Four times a day (QID) | RECTAL | Status: DC | PRN

## 2017-11-12 MED ORDER — SODIUM CHLORIDE 0.9 % IV BOLUS (SEPSIS)
1000.0000 mL | Freq: Once | INTRAVENOUS | Status: AC
  Administered 2017-11-12: 1000 mL via INTRAVENOUS

## 2017-11-12 MED ORDER — DEXTROSE-NACL 5-0.2 % IV SOLN
INTRAVENOUS | Status: DC
  Administered 2017-11-13 – 2017-11-14 (×4): via INTRAVENOUS

## 2017-11-12 MED ORDER — ENOXAPARIN SODIUM 40 MG/0.4ML ~~LOC~~ SOLN: 40.0000 mg | SUBCUTANEOUS | Status: DC

## 2017-11-12 MED ORDER — ACETAMINOPHEN 325 MG PO TABS: 650.0000 mg | ORAL_TABLET | Freq: Four times a day (QID) | ORAL | Status: DC | PRN

## 2017-11-12 NOTE — Progress Notes (Signed)
Pharmacy Antibiotic Note  Margurite AuerbachKarin E Crutcher is a 76 y.o. female admitted on 11/12/2017 with UTI.  Pharmacy has been consulted for ceftriaxone dosing.  Plan: Ceftriaxone 2 grams q 24 hours ordered.  Weight: 89 lb 5 oz (40.5 kg)  Temp (24hrs), Avg:97.7 F (36.5 C), Min:97.1 F (36.2 C), Max:98.2 F (36.8 C)  Recent Labs  Lab 11/12/17 1740  WBC 17.0*  CREATININE 1.27*    Estimated Creatinine Clearance: 24.1 mL/min (A) (by C-G formula based on SCr of 1.27 mg/dL (H)).    Allergies  Allergen Reactions  . Penicillins Other (See Comments)    Has patient had a PCN reaction causing immediate rash, facial/tongue/throat swelling, SOB or lightheadedness with hypotension: Unknown Has patient had a PCN reaction causing severe rash involving mucus membranes or skin necrosis: Unknown Has patient had a PCN reaction that required hospitalization: Unknown Has patient had a PCN reaction occurring within the last 10 years: Unknown If all of the above answers are "NO", then may proceed with Cephalosporin use.     Antimicrobials this admission: Ceftriaxone 12/16  >>    >>   Dose adjustments this admission:   Microbiology results: 12/16 UCx: pending  12/16 MRSA PCR: pending      12/16 UA: LE(-)  NO(-)  WBC 6-30 Thank you for allowing pharmacy to be a part of this patient's care.  Gregorio Worley S 11/12/2017 11:14 PM

## 2017-11-12 NOTE — ED Provider Notes (Addendum)
[facility] Emergency Department Provider Note    First MD Initiated Contact with Patient 11/12/17 1718     (approximate)  I have reviewed the triage vital signs and the nursing notes.   HISTORY  Chief Complaint Altered Mental Status  Level V Caveat:  Advanced dementia  HPI Donna Moss is a 76 y.o. female with advanced dementia with a history of depression and anxiety presents with worsening confusion, decreased oral intake over the past several days.  Family is at bedside with patient states that this is a significant deterioration from her baseline over just a few days.  They feel that she likely has a urinary tract infection as this is similar to how she is presented the past but never this severe.  No fevers reported.  No recent antibiotics.  No other changes in medications.  Denies any complaints of pain.  No recent trauma or falls.  Past Medical History:  Diagnosis Date  . Anxiety   . Dementia   . Depression   . HLD (hyperlipidemia)    Family History  Problem Relation Age of Onset  . Cancer Mother    Past Surgical History:  Procedure Laterality Date  . COLONOSCOPY    . HIP ARTHROPLASTY Left 07/06/2017   Procedure: ARTHROPLASTY BIPOLAR HIP (HEMIARTHROPLASTY);  Surgeon: Kennedy BuckerMenz, Michael, MD;  Location: [facility] ORS;  Service: Orthopedics;  Laterality: Left;   Patient Active Problem List   Diagnosis Date Noted  . Closed left hip fracture (HCC) 07/05/2017  . HLD (hyperlipidemia) 07/05/2017  . Dementia 07/05/2017      Prior to Admission medications   Medication Sig Start Date End Date Taking? Authorizing Provider  acetaminophen (TYLENOL) 325 MG tablet Take 2 tablets (650 mg total) by mouth every 6 (six) hours as needed for mild pain (or Fever >/= 101). Patient taking differently: Take 650 mg by mouth 3 (three) times daily.  07/07/17  Yes Altamese DillingVachhani, Vaibhavkumar, MD  acetaminophen (TYLENOL) 500 MG tablet Take 500 mg by mouth 2 (two) times daily as  needed for mild pain, fever or headache.   Yes [provider]  bisacodyl (DULCOLAX) 10 MG suppository Place 1 suppository (10 mg total) rectally daily as needed for moderate constipation. 07/07/17  Yes Altamese DillingVachhani, Vaibhavkumar, MD  clonazePAM (KLONOPIN) 0.5 MG tablet Take 0.5 tablets (0.25 mg total) by mouth at bedtime as needed for anxiety. Patient taking differently: Take 0.125-0.25 mg by mouth every morning. Take 0.125 mg by mouth in the morning and 0.25 mg by mouth at 7 pm. 07/07/17  Yes Altamese DillingVachhani, Vaibhavkumar, MD  Cranberry 425 MG CAPS Take 1 capsule by mouth 2 (two) times daily.   Yes [provider]  ergocalciferol (VITAMIN D2) 50000 units capsule Take 50,000 Units by mouth every 30 (thirty) days.   Yes [provider]  mirtazapine (REMERON) 15 MG tablet Take 7.5 mg by mouth at bedtime.   Yes [provider]  PARoxetine (PAXIL) 20 MG tablet Take 20 mg by mouth daily.    Yes [provider]  polyethylene glycol (MIRALAX / GLYCOLAX) packet Take 17 g by mouth daily.   Yes [provider]  vitamin B-12 (CYANOCOBALAMIN) 1000 MCG tablet Take 1,000 mcg by mouth See admin instructions. Mondays, Wednesday, Fridays   Yes [provider]  docusate sodium (COLACE) 100 MG capsule Take 1 capsule (100 mg total) by mouth 2 (two) times daily. Patient not taking: Reported on 11/12/2017 07/07/17   Altamese DillingVachhani, Vaibhavkumar, MD  enoxaparin (LOVENOX) 40 MG/0.4ML  injection Inject 0.4 mLs (40 mg total) into the skin daily. 07/08/17 07/20/17  Altamese DillingVachhani, Vaibhavkumar, MD  HYDROcodone-acetaminophen (NORCO/VICODIN) 5-325 MG tablet Take 1 tablet by mouth every 4 (four) hours as needed (breakthrough pain). Patient not taking: Reported on 11/12/2017 07/07/17   Altamese DillingVachhani, Vaibhavkumar, MD  traZODone (DESYREL) 50 MG tablet Take 1 tablet (50 mg total) by mouth at bedtime. Patient not taking: Reported on 11/12/2017 07/07/17   Altamese DillingVachhani, Vaibhavkumar, MD     Allergies Penicillins    Social History Social History   Tobacco Use  . Smoking status: Former Games developermoker  . Smokeless tobacco: Never Used  Substance Use Topics  . Alcohol use: No  . Drug use: No    Review of Systems Patient denies headaches, rhinorrhea, blurry vision, numbness, shortness of breath, chest pain, edema, cough, abdominal pain, nausea, vomiting, diarrhea, dysuria, fevers, rashes or hallucinations unless otherwise stated above in HPI. ____________________________________________   PHYSICAL EXAM:  VITAL SIGNS: Vitals:   11/12/17 1945 11/12/17 2000  BP: 127/81 112/68  Pulse:  91  Resp:    Temp:    SpO2:  95%    Constitutional: Encephalopathic, minimally responsive but protecting her airway Eyes: Conjunctivae are normal.  Head: Atraumatic. Nose: No congestion/rhinnorhea. Mouth/Throat: Mucous membranes are moist.   Neck: No stridor. Painless ROM.  Cardiovascular: Normal rate, regular rhythm. Grossly normal heart sounds.  Good peripheral circulation. Respiratory: Normal respiratory effort.  No retractions. Lungs CTAB. Gastrointestinal: Soft and nontender. No distention. No abdominal bruits. No CVA tenderness. Genitourinary: deferred Musculoskeletal: No lower extremity tenderness nor edema.  No joint effusions. Neurologic: Full neuro exam limited.  Moving all extremities spontaneously.  No facial droop.  Pupils are 3 mm and reactive. Skin:  Skin is warm, dry and intact. No rash noted. Psychiatric: Mood and affect are normal. Speech and behavior are normal.  ____________________________________________   LABS (all labs ordered are listed, but only abnormal results are displayed)  Results for orders placed or performed during the hospital encounter of 11/12/17 (from the past 24 hour(s))  CBC with Differential/Platelet     Status: Abnormal   Collection Time: 11/12/17  5:40 PM  Result Value Ref Range   WBC 17.0 (H) 3.6 - 11.0 K/uL   RBC 4.97 3.80 - 5.20  MIL/uL   Hemoglobin 15.5 12.0 - 16.0 g/dL   HCT 57.847.6 (H) 46.935.0 - 62.947.0 %   MCV 95.8 80.0 - 100.0 fL   MCH 31.2 26.0 - 34.0 pg   MCHC 32.6 32.0 - 36.0 g/dL   RDW 52.813.2 41.311.5 - 24.414.5 %   Platelets 308 150 - 440 K/uL   Neutrophils Relative % 78 %   Neutro Abs 13.3 (H) 1.4 - 6.5 K/uL   Lymphocytes Relative 14 %   Lymphs Abs 2.3 1.0 - 3.6 K/uL   Monocytes Relative 7 %   Monocytes Absolute 1.2 (H) 0.2 - 0.9 K/uL   Eosinophils Relative 0 %   Eosinophils Absolute 0.0 0 - 0.7 K/uL   Basophils Relative 1 %   Basophils Absolute 0.1 0 - 0.1 K/uL  Comprehensive metabolic panel     Status: Abnormal   Collection Time: 11/12/17  5:40 PM  Result Value Ref Range   Sodium 160 (H) 135 - 145 mmol/L   Potassium 3.4 (L) 3.5 - 5.1 mmol/L   Chloride 124 (H) 101 - 111 mmol/L   CO2 23 22 - 32 mmol/L   Glucose, Bld 146 (H) 65 - 99 mg/dL   BUN 70 (H) 6 - 20  mg/dL   Creatinine, Ser 1.61 (H) 0.44 - 1.00 mg/dL   Calcium 9.9 8.9 - 09.6 mg/dL   Total Protein 8.1 6.5 - 8.1 g/dL   Albumin 4.3 3.5 - 5.0 g/dL   AST 61 (H) 15 - 41 U/L   ALT 126 (H) 14 - 54 U/L   Alkaline Phosphatase 57 38 - 126 U/L   Total Bilirubin 1.9 (H) 0.3 - 1.2 mg/dL   GFR calc non Af Amer 40 (L) >60 mL/min   GFR calc Af Amer 46 (L) >60 mL/min   Anion gap 13 5 - 15  Urinalysis, Complete w Microscopic     Status: Abnormal   Collection Time: 11/12/17  8:00 PM  Result Value Ref Range   Color, Urine AMBER (A) YELLOW   APPearance HAZY (A) CLEAR   Specific Gravity, Urine 1.026 1.005 - 1.030   pH 5.0 5.0 - 8.0   Glucose, UA NEGATIVE NEGATIVE mg/dL   Hgb urine dipstick SMALL (A) NEGATIVE   Bilirubin Urine NEGATIVE NEGATIVE   Ketones, ur NEGATIVE NEGATIVE mg/dL   Protein, ur 30 (A) NEGATIVE mg/dL   Nitrite NEGATIVE NEGATIVE   Leukocytes, UA NEGATIVE NEGATIVE   RBC / HPF 0-5 0 - 5 RBC/hpf   WBC, UA 6-30 0 - 5 WBC/hpf   Bacteria, UA MANY (A) NONE SEEN   Squamous Epithelial / LPF 0-5 (A) NONE SEEN   Mucus PRESENT     ____________________________________________  ED ECG REPORT I, Willy Eddy, the attending physician, personally viewed and interpreted this ECG.   Date: 11/12/2017  EKG Time: 20:44  Rate: 90  Rhythm: sinus  Axis: normal  Intervals: normal intervals,  ST&T Change: inferolateral t wave inversions, no stemi   _______________________________________  RADIOLOGY  I personally reviewed all radiographic images ordered to evaluate for the above acute complaints and reviewed radiology reports and findings.  These findings were personally discussed with the patient.  Please see medical record for radiology report.  ____________________________________________   PROCEDURES  Procedure(s) performed:  .Critical Care Performed by: Willy Eddy, MD Authorized by: Willy Eddy, MD   Critical care provider statement:    Critical care time (minutes):  45   Critical care time was exclusive of:  Separately billable procedures and treating other patients   Critical care was necessary to treat or prevent imminent or life-threatening deterioration of the following conditions:  Metabolic crisis   Critical care was time spent personally by me on the following activities:  Development of treatment plan with patient or surrogate, discussions with consultants, evaluation of patient's response to treatment, examination of patient, obtaining history from patient or surrogate, ordering and performing treatments and interventions, ordering and review of laboratory studies, ordering and review of radiographic studies, pulse oximetry, re-evaluation of patient's condition and review of old charts      Critical Care performed: yes ____________________________________________   INITIAL IMPRESSION / ASSESSMENT AND PLAN / ED COURSE  Pertinent labs & imaging results that were available during my care of the patient were reviewed by me and considered in my medical decision making (see chart for  details).  DDX: Dehydration, sepsis, pna, uti, hypoglycemia, cva, drug effect, withdrawal, encephalitis   TIANA SIVERTSON is a 76 y.o. who presents to the ED with altered mental status as described above.  Patient protecting her airway but she is critically ill-appearing.  Does appear profoundly dehydrated.  Blood work sent for the above differential shows acute hyponatremia with uremia as well both of which could be  contributing to patient's encephalopathy.   There is acute leukocytosis with left shift. No evidence of pneumonia or heart failure.  There is evidence of urinary tract infection.  CT head with no evidence of subdural hematoma.  Patient with a free water deficit of 2.6 L.  Patient is a DNR but will require hospitalization for hemodynamic and get during fluid correction.  IV antibiotics ordered for urinary tract infection.      ____________________________________________   FINAL CLINICAL IMPRESSION(S) / ED DIAGNOSES  Final diagnoses:  Acute encephalopathy  Acute hypernatremia  Acute cystitis without hematuria      NEW MEDICATIONS STARTED DURING THIS VISIT:  This SmartLink is deprecated. Use AVSMEDLIST instead to display the medication list for a patient.   Note:  This document was prepared using Dragon voice recognition software and may include unintentional dictation errors.    Willy Eddy, MD 11/12/17 0981    Willy Eddy, MD 11/12/17 2053

## 2017-11-12 NOTE — H&P (Signed)
[facility] - [place] at [facility]   PATIENT NAME: Donna Moss    MR#:  [id number]  DATE OF BIRTH:  [birth year]  DATE OF ADMISSION:  11/12/2017  PRIMARY CARE PHYSICIAN: Patient, No Pcp Per   REQUESTING/REFERRING PHYSICIAN: Roxan Hockeyobinson, MD  CHIEF COMPLAINT:   Chief Complaint  Patient presents with  . Altered Mental Status    HISTORY OF PRESENT ILLNESS:  Donna Moss  is a 76 y.o. female who presents with couple days of increased lethargy.  Patient has dementia at baseline and family who is at bedside today provides HPI.  They state that patient's baseline functioning is verbal, but typically confused.  For the past couple of days she has been more lethargic and not really talking much at all.  She is also not been eating or drinking much.  Here in the ED UA was suspicious for UTI.  Her sodium level was significantly elevated at 16o and she had a little bit of AKI.  Hospitalist were called for admission  PAST MEDICAL HISTORY:   Past Medical History:  Diagnosis Date  . Anxiety   . Dementia   . Depression   . HLD (hyperlipidemia)     PAST SURGICAL HISTORY:   Past Surgical History:  Procedure Laterality Date  . COLONOSCOPY    . HIP ARTHROPLASTY Left 07/06/2017   Procedure: ARTHROPLASTY BIPOLAR HIP (HEMIARTHROPLASTY);  Surgeon: Kennedy BuckerMenz, Michael, MD;  Location: [facility] ORS;  Service: Orthopedics;  Laterality: Left;    SOCIAL HISTORY:   Social History   Tobacco Use  . Smoking status: Former Games developermoker  . Smokeless tobacco: Never Used  Substance Use Topics  . Alcohol use: No    FAMILY HISTORY:   Family History  Problem Relation Age of Onset  . Cancer Mother     DRUG ALLERGIES:   Allergies  Allergen Reactions  . Penicillins Other (See Comments)    Has patient had a PCN reaction causing immediate rash, facial/tongue/throat swelling, SOB or lightheadedness with hypotension: Unknown Has patient had a PCN reaction causing severe rash involving  mucus membranes or skin necrosis: Unknown Has patient had a PCN reaction that required hospitalization: Unknown Has patient had a PCN reaction occurring within the last 10 years: Unknown If all of the above answers are "NO", then may proceed with Cephalosporin use.     MEDICATIONS AT HOME:   Prior to Admission medications   Medication Sig Start Date End Date Taking? Authorizing Provider  acetaminophen (TYLENOL) 325 MG tablet Take 2 tablets (650 mg total) by mouth every 6 (six) hours as needed for mild pain (or Fever >/= 101). Patient taking differently: Take 650 mg by mouth 3 (three) times daily.  07/07/17  Yes Altamese DillingVachhani, Vaibhavkumar, MD  acetaminophen (TYLENOL) 500 MG tablet Take 500 mg by mouth 2 (two) times daily as needed for mild pain, fever or headache.   Yes [provider]  bisacodyl (DULCOLAX) 10 MG suppository Place 1 suppository (10 mg total) rectally daily as needed for moderate constipation. 07/07/17  Yes Altamese DillingVachhani, Vaibhavkumar, MD  clonazePAM (KLONOPIN) 0.5 MG tablet Take 0.5 tablets (0.25 mg total) by mouth at bedtime as needed for anxiety. Patient taking differently: Take 0.125-0.25 mg by mouth every morning. Take 0.125 mg by mouth in the morning and 0.25 mg by mouth at 7 pm. 07/07/17  Yes Altamese DillingVachhani, Vaibhavkumar, MD  Cranberry 425 MG CAPS Take 1 capsule by mouth 2 (two) times daily.   Yes [provider]  ergocalciferol (VITAMIN D2) 50000 units  capsule Take 50,000 Units by mouth every 30 (thirty) days.   Yes [provider]  mirtazapine (REMERON) 15 MG tablet Take 7.5 mg by mouth at bedtime.   Yes [provider]  PARoxetine (PAXIL) 20 MG tablet Take 20 mg by mouth daily.    Yes [provider]  polyethylene glycol (MIRALAX / GLYCOLAX) packet Take 17 g by mouth daily.   Yes [provider]  vitamin B-12 (CYANOCOBALAMIN) 1000 MCG tablet Take 1,000 mcg by mouth See admin instructions. Mondays, Wednesday, Fridays   Yes [provider]  docusate sodium (COLACE) 100 MG capsule Take 1 capsule (100 mg total) by mouth 2 (two) times daily. Patient not taking: Reported on 11/12/2017 07/07/17   Altamese Dilling, MD  enoxaparin (LOVENOX) 40 MG/0.4ML injection Inject 0.4 mLs (40 mg total) into the skin daily. 07/08/17 07/20/17  Altamese Dilling, MD  HYDROcodone-acetaminophen (NORCO/VICODIN) 5-325 MG tablet Take 1 tablet by mouth every 4 (four) hours as needed (breakthrough pain). Patient not taking: Reported on 11/12/2017 07/07/17   Altamese Dilling, MD  traZODone (DESYREL) 50 MG tablet Take 1 tablet (50 mg total) by mouth at bedtime. Patient not taking: Reported on 11/12/2017 07/07/17   Altamese Dilling, MD    REVIEW OF SYSTEMS:  Review of Systems  Unable to perform ROS: Dementia     VITAL SIGNS:   Vitals:   11/12/17 1915 11/12/17 1930 11/12/17 1945 11/12/17 2000  BP: 124/85 (!) 119/94 127/81 112/68  Pulse: 94 92  91  Resp:      Temp:      TempSrc:      SpO2: 94% 95%  95%  Weight:       Wt Readings from Last 3 Encounters:  11/12/17 40.5 kg (89 lb 5 oz)  07/06/17 51.1 kg (112 lb 9.6 oz)    PHYSICAL EXAMINATION:  Physical Exam  Vitals reviewed. Constitutional: She appears well-developed and well-nourished. No distress.  HENT:  Head: Normocephalic and atraumatic.  Dry mucous membranes  Eyes: Conjunctivae and EOM are normal. Pupils are equal, round, and reactive to light. No scleral icterus.  Neck: Normal range of motion. Neck supple. No JVD present. No thyromegaly present.  Cardiovascular: Normal rate, regular rhythm and intact distal pulses. Exam reveals no gallop and no friction rub.  No murmur heard. Respiratory: Effort normal and breath sounds normal. No respiratory distress. She has no wheezes. She has no rales.  GI: Soft. Bowel sounds are normal. She exhibits no distension. There is no tenderness.  Musculoskeletal: Normal range of motion. She exhibits no edema.  No  arthritis, no gout  Lymphadenopathy:    She has no cervical adenopathy.  Neurological:  Unable to assess due to dementia and patient condition  Skin: Skin is warm and dry. No rash noted. No erythema.  Psychiatric:  Unable to assess due to dementia    LABORATORY PANEL:   CBC Recent Labs  Lab 11/12/17 1740  WBC 17.0*  HGB 15.5  HCT 47.6*  PLT 308   ------------------------------------------------------------------------------------------------------------------  Chemistries  Recent Labs  Lab 11/12/17 1740  NA 160*  K 3.4*  CL 124*  CO2 23  GLUCOSE 146*  BUN 70*  CREATININE 1.27*  CALCIUM 9.9  AST 61*  ALT 126*  ALKPHOS 57  BILITOT 1.9*   ------------------------------------------------------------------------------------------------------------------  Cardiac Enzymes No results for input(s): TROPONINI in the last 168 hours. ------------------------------------------------------------------------------------------------------------------  RADIOLOGY:  Ct Head Wo Contrast  Result Date: 11/12/2017 CLINICAL DATA:  Change in mental status x1 week EXAM:  CT HEAD WITHOUT CONTRAST TECHNIQUE: Contiguous axial images were obtained from the base of the skull through the vertex without intravenous contrast. COMPARISON:  None. FINDINGS: BRAIN: There is sulcal and ventricular prominence consistent with superficial and central atrophy. No intraparenchymal hemorrhage, mass effect nor midline shift. Periventricular and subcortical white matter hypodensities consistent with minimal chronic small vessel ischemic disease are identified. No acute large vascular territory infarcts. No abnormal extra-axial fluid collections. Basal cisterns are not effaced and midline. VASCULAR: Moderate calcific atherosclerosis of the carotid siphons. SKULL: No skull fracture. No significant scalp soft tissue swelling. SINUSES/ORBITS: The mastoid air-cells are clear. The included paranasal sinuses are  well-aerated.The included ocular globes and orbital contents are non-suspicious. OTHER: None. IMPRESSION: Atrophy with small vessel ischemic disease. No acute intracranial abnormality. Electronically Signed   By: Tollie Eth M.D.   On: 11/12/2017 20:34   Dg Chest Portable 1 View  Result Date: 11/12/2017 CLINICAL DATA:  Mental status change x1 week. Advanced Alzheimer's dementia. EXAM: PORTABLE CHEST 1 VIEW COMPARISON:  07/05/2017 FINDINGS: Top-normal size heart. Moderate aortic atherosclerosis at the arch and descending aorta without aneurysm. Emphysematous hyperinflation of the lungs with chronic atelectasis and/or minimal scarring at the left lung base. No pneumonic consolidation, effusion or pneumothorax. No acute nor suspicious osseous abnormalities. Extracranial carotid arteriosclerosis is noted bilaterally of the included neck. IMPRESSION: Mild hyperinflation of the lungs with chronic left basilar atelectasis and/or scarring. Aortic atherosclerosis. Electronically Signed   By: Tollie Eth M.D.   On: 11/12/2017 20:12    EKG:   Orders placed or performed during the hospital encounter of 11/12/17  . ED EKG  . ED EKG  . ED EKG  . ED EKG    IMPRESSION AND PLAN:  Principal Problem:   Hypernatremia -likely due to urine infection and profound dehydration.  This is likely occurred over a couple of days, so we will correct her relatively slowly.  She got a saline bolus in the ED, her free water deficit is about 2-2-1/2 L.  We will continue IV fluids at a much lower rate with a goal of correcting her sodium over the next 36-48 hours. Active Problems:   UTI (urinary tract infection) -IV antibiotics, urine culture sent   AKI (acute kidney injury) (HCC) -due to UTI and dehydration, IV fluids as above, avoid nephrotoxins and monitor   Anxiety -home dose anxiolytics   Dementia -patient is verbal at baseline, though typically confused, she is currently very lethargic, monitor for improvement with  treatment as above  All the records are reviewed and case discussed with ED provider. Management plans discussed with the patient and/or family.  DVT PROPHYLAXIS: SubQ lovenox  GI PROPHYLAXIS: None  ADMISSION STATUS: Inpatient  CODE STATUS: DNR Code Status History    Date Active Date Inactive Code Status Order ID Comments User Context   07/06/2017 17:45 07/08/2017 13:09 DNR [id number]  Oralia Manis, MD Inpatient   07/06/2017 00:02 07/06/2017 17:45 Full Code [id number]  Oralia Manis, MD ED    Questions for Most Recent Historical Code Status (Order [id number])    Question Answer Comment   In the event of cardiac or respiratory ARREST Do not call a "code blue"    In the event of cardiac or respiratory ARREST Do not perform Intubation, CPR, defibrillation or ACLS    In the event of cardiac or respiratory ARREST Use medication by any route, position, wound care, and other measures to relive pain and suffering. May use oxygen, suction and manual treatment of  airway obstruction as needed for comfort.         Advance Directive Documentation     Most Recent Value  Type of Advance Directive  Out of facility DNR (pink MOST or yellow form)  Pre-existing out of facility DNR order (yellow form or pink MOST form)  Yellow form placed in chart (order not valid for inpatient use)  "MOST" Form in Place?  No data      TOTAL TIME TAKING CARE OF THIS PATIENT: 45 minutes.   Yazaira Speas FIELDING 11/12/2017, 9:04 PM  [facility]  Office  [phone]  CC: Primary care physician; Patient, No Pcp Per  Note:  This document was prepared using Dragon voice recognition software and may include unintentional dictation errors.

## 2017-11-12 NOTE — Telephone Encounter (Signed)
[facility] on call. [facility]- Dr. Alphonsus SiasLetvak patient  No records outpatient within [facility].   Told by nursing staff patient has been more tired than usual. They were able to wake her up for meds and she took them appropriately. Stated normal vitals other than heart rate slightly high at 105.   Family was requesting further workup with bloodwork and UA. Without evaluating patient or having records, unclear what bloodwork would be beneficial for patient. UA could lead to unneeded antibiotics- instead advised to get urine culture and contact Dr. Alphonsus SiasLetvak tomorrow who would have more information on patient.   If she has worsening vital signs she can be taken in for evaluation this evening.   Tana ConchStephen Hunter

## 2017-11-12 NOTE — ED Triage Notes (Signed)
Per report to [facility] from [facility] pt is normally not able to follow commands but does some vocalizations. Sent in for decreased intake and output

## 2017-11-12 NOTE — ED Triage Notes (Signed)
Sent in by daughter for possible uti - change in mental status x 1 week. Advanced alzheimer's/dementia, decreased po intake,

## 2017-11-12 NOTE — Progress Notes (Signed)
Lovenox changed to 30 mg daily for TBW <45kg and CrCl >30. 

## 2017-11-13 ENCOUNTER — Telehealth: Payer: Self-pay

## 2017-11-13 DIAGNOSIS — L8990 Pressure ulcer of unspecified site, unspecified stage: Secondary | ICD-10-CM

## 2017-11-13 LAB — CBC
HEMATOCRIT: 40.3 % (ref 35.0–47.0)
Hemoglobin: 13.5 g/dL (ref 12.0–16.0)
MCH: 32.2 pg (ref 26.0–34.0)
MCHC: 33.6 g/dL (ref 32.0–36.0)
MCV: 95.8 fL (ref 80.0–100.0)
Platelets: 254 10*3/uL (ref 150–440)
RBC: 4.21 MIL/uL (ref 3.80–5.20)
RDW: 13.6 % (ref 11.5–14.5)
WBC: 12.3 10*3/uL — AB (ref 3.6–11.0)

## 2017-11-13 LAB — SODIUM
Sodium: 157 mmol/L — ABNORMAL HIGH (ref 135–145)
Sodium: 155 mmol/L — ABNORMAL HIGH (ref 135–145)
Sodium: 155 mmol/L — ABNORMAL HIGH (ref 135–145)

## 2017-11-13 LAB — TROPONIN I: Troponin I: 0.03 ng/mL (ref <0.03)

## 2017-11-13 LAB — BASIC METABOLIC PANEL
Anion gap: 6 (ref 5–15)
BUN: 59 mg/dL — AB (ref 6–20)
CHLORIDE: 128 mmol/L — AB (ref 101–111)
CO2: 25 mmol/L (ref 22–32)
Calcium: 9.1 mg/dL (ref 8.9–10.3)
Creatinine, Ser: 1 mg/dL (ref 0.44–1.00)
GFR calc Af Amer: >60 mL/min (ref >60)
GFR calc non Af Amer: 53 mL/min — ABNORMAL LOW (ref >60)
GLUCOSE: 195 mg/dL — AB (ref 65–99)
POTASSIUM: 3.1 mmol/L — AB (ref 3.5–5.1)
Sodium: 159 mmol/L — ABNORMAL HIGH (ref 135–145)

## 2017-11-13 LAB — MRSA PCR SCREENING: MRSA by PCR: NEGATIVE

## 2017-11-13 MED ORDER — ENSURE ENLIVE PO LIQD
237.0000 mL | Freq: Two times a day (BID) | ORAL | Status: DC
  Administered 2017-11-13 – 2017-11-15 (×4): 237 mL via ORAL

## 2017-11-13 MED ORDER — POTASSIUM CHLORIDE 20 MEQ PO PACK
40.0000 meq | PACK | Freq: Once | ORAL | Status: AC
  Administered 2017-11-13: 40 meq via ORAL
  Filled 2017-11-13: qty 2

## 2017-11-13 MED ORDER — ORAL CARE MOUTH RINSE
15.0000 mL | Freq: Two times a day (BID) | OROMUCOSAL | Status: DC
  Administered 2017-11-13 – 2017-11-14 (×2): 15 mL via OROMUCOSAL

## 2017-11-13 MED ORDER — CLONAZEPAM 0.125 MG PO TBDP
0.2500 mg | ORAL_TABLET | Freq: Every evening | ORAL | Status: DC
  Administered 2017-11-14: 19:00:00 0.25 mg via ORAL
  Filled 2017-11-13 (×2): qty 2

## 2017-11-13 MED ORDER — CLONAZEPAM 0.125 MG PO TBDP
0.1250 mg | ORAL_TABLET | Freq: Every day | ORAL | Status: DC
  Administered 2017-11-14 – 2017-11-15 (×2): 0.125 mg via ORAL
  Filled 2017-11-13 (×2): qty 1

## 2017-11-13 NOTE — Progress Notes (Signed)
[facility] [place] at [facility]   PATIENT NAME: Donna Moss    MR#:  [id number]  DATE OF BIRTH:  January 25, 1941  SUBJECTIVE:   Patient here due to altered mental status and noted to have a urinary tract infection along with severe dehydration and hypernatremia. Family at bedside, patient is a bit more awake since yesterday as per family.  REVIEW OF SYSTEMS:    Review of Systems  Unable to perform ROS: Dementia    Nutrition: Dysphagia 1 with thin liquids Tolerating Diet: Yes Tolerating PT: bedbound at baseline     DRUG ALLERGIES:   Allergies  Allergen Reactions  . Penicillins Other (See Comments)    Has patient had a PCN reaction causing immediate rash, facial/tongue/throat swelling, SOB or lightheadedness with hypotension: Unknown Has patient had a PCN reaction causing severe rash involving mucus membranes or skin necrosis: Unknown Has patient had a PCN reaction that required hospitalization: Unknown Has patient had a PCN reaction occurring within the last 10 years: Unknown If all of the above answers are "NO", then may proceed with Cephalosporin use.     VITALS:  Blood pressure 123/64, pulse 90, temperature 97.7 F (36.5 C), temperature source Axillary, resp. rate 16, height 5\' 4"  (1.626 m), weight 44.4 kg (97 lb 14.4 oz), SpO2 96 %.  PHYSICAL EXAMINATION:   Physical Exam  GENERAL:  76 y.o.-year-old patient lying in bed confused, awake but in NAD.  EYES: Pupils equal, round, reactive to light. No scleral icterus. Extraocular muscles intact.  HEENT: Head atraumatic, normocephalic. Oropharynx and nasopharynx clear. Dry Oral mucosa NECK:  Supple, no jugular venous distention. No thyroid enlargement, no tenderness.  LUNGS: Normal breath sounds bilaterally, no wheezing, rales, rhonchi. No use of accessory muscles of respiration.  CARDIOVASCULAR: S1, S2 normal. No murmurs, rubs, or gallops.  ABDOMEN: Soft, nontender, nondistended. Bowel sounds  present. No organomegaly or mass.  EXTREMITIES: No cyanosis, clubbing or edema b/l.    NEUROLOGIC: Cranial nerves II through XII are intact. No focal Motor or sensory deficits b/l.  Globally weak PSYCHIATRIC: The patient is alert and oriented x 1.  SKIN: No obvious rash, lesion, or ulcer.    LABORATORY PANEL:   CBC Recent Labs  Lab 11/13/17 0417  WBC 12.3*  HGB 13.5  HCT 40.3  PLT 254   ------------------------------------------------------------------------------------------------------------------  Chemistries  Recent Labs  Lab 11/12/17 1740  11/13/17 0417 11/13/17 1022  NA 160*   < > 159* 157*  K 3.4*  --  3.1*  --   CL 124*  --  128*  --   CO2 23  --  25  --   GLUCOSE 146*  --  195*  --   BUN 70*  --  59*  --   CREATININE 1.27*  --  1.00  --   CALCIUM 9.9  --  9.1  --   AST 61*  --   --   --   ALT 126*  --   --   --   ALKPHOS 57  --   --   --   BILITOT 1.9*  --   --   --    < > = values in this interval not displayed.   ------------------------------------------------------------------------------------------------------------------  Cardiac Enzymes Recent Labs  Lab 11/13/17 0417  TROPONINI 0.03*   ------------------------------------------------------------------------------------------------------------------  RADIOLOGY:  Ct Head Wo Contrast  Result Date: 11/12/2017 CLINICAL DATA:  Change in mental status x1 week EXAM: CT HEAD WITHOUT CONTRAST TECHNIQUE: Contiguous axial images were  obtained from the base of the skull through the vertex without intravenous contrast. COMPARISON:  None. FINDINGS: BRAIN: There is sulcal and ventricular prominence consistent with superficial and central atrophy. No intraparenchymal hemorrhage, mass effect nor midline shift. Periventricular and subcortical white matter hypodensities consistent with minimal chronic small vessel ischemic disease are identified. No acute large vascular territory infarcts. No abnormal extra-axial  fluid collections. Basal cisterns are not effaced and midline. VASCULAR: Moderate calcific atherosclerosis of the carotid siphons. SKULL: No skull fracture. No significant scalp soft tissue swelling. SINUSES/ORBITS: The mastoid air-cells are clear. The included paranasal sinuses are well-aerated.The included ocular globes and orbital contents are non-suspicious. OTHER: None. IMPRESSION: Atrophy with small vessel ischemic disease. No acute intracranial abnormality. Electronically Signed   By: Tollie Ethavid  Kwon M.D.   On: 11/12/2017 20:34   Dg Chest Portable 1 View  Result Date: 11/12/2017 CLINICAL DATA:  Mental status change x1 week. Advanced Alzheimer's dementia. EXAM: PORTABLE CHEST 1 VIEW COMPARISON:  07/05/2017 FINDINGS: Top-normal size heart. Moderate aortic atherosclerosis at the arch and descending aorta without aneurysm. Emphysematous hyperinflation of the lungs with chronic atelectasis and/or minimal scarring at the left lung base. No pneumonic consolidation, effusion or pneumothorax. No acute nor suspicious osseous abnormalities. Extracranial carotid arteriosclerosis is noted bilaterally of the included neck. IMPRESSION: Mild hyperinflation of the lungs with chronic left basilar atelectasis and/or scarring. Aortic atherosclerosis. Electronically Signed   By: Tollie Ethavid  Kwon M.D.   On: 11/12/2017 20:12     ASSESSMENT AND PLAN:   76 year old female with past medical history advanced dementia, hypertension, hyperlipidemia, anxiety/depression who presented to the hospital due to altered mental status.  1. Altered mental status-metabolic encephalopathy secondary to severe hypernatremia combined with underlying UTI. -Continue IV fluids to treat the hypernatremia, continue IV antibiotics for the UTI. Follow mental status which is improving.  2. Hypernatremia-secondary to severe dehydration and poor by mouth intake. -Continue D5W, follow sodium which is improving.  3. Urinary tract infection-patient's  urinalysis is not that grossly positive. Urine cultures are negative so far. Continue per ceftriaxone, we'll treat for a total of 5 days.  4. Acute kidney injury-secondary to dehydration, continue IV fluid hydration. Creatinine is improving.  5. Anxiety/depression-continue Klonopin, Paxil, Remeron.     All the records are reviewed and case discussed with Care Management/Social Worker. Management plans discussed with the patient, family and they are in agreement.  CODE STATUS: DO NOT RESUSCITATE  DVT Prophylaxis: Lovenox  TOTAL TIME TAKING CARE OF THIS PATIENT: 30 minutes.   POSSIBLE D/C IN 1-2 DAYS, DEPENDING ON CLINICAL CONDITION.   Houston SirenSAINANI,Yoshimi Sarr J M.D on 11/13/2017 at 1:52 PM  Between 7am to 6pm - Pager - [phone]  After 6pm go to [email or web address] - Social research officer, governmentpassword EPAS [facility]  [facility]  Office  [phone]  CC: Primary care physician; Karie SchwalbeLetvak, Richard I, MD

## 2017-11-13 NOTE — Telephone Encounter (Signed)
Please add me as PCP Will follow progress in hospital

## 2017-11-13 NOTE — Telephone Encounter (Signed)
Per chart review tab pt is presently admitted to [facility]. 

## 2017-11-13 NOTE — Progress Notes (Signed)
Initial Nutrition Assessment  DOCUMENTATION CODES:   Severe malnutrition in context of chronic illness, Underweight  INTERVENTION:  Provide Ensure Enlive po BID, each supplement provides 350 kcal and 20 grams of protein. Patient prefers chocolate or strawberry.  Provide Magic cup TID with meals, each supplement provides 290 kcal and 9 grams of protein. Patient prefers chocolate or orange.  NUTRITION DIAGNOSIS:   Severe Malnutrition related to chronic illness(dementia, recent hip fracture) as evidenced by severe fat depletion, severe muscle depletion.  GOAL:   Patient will meet greater than or equal to 90% of their needs  MONITOR:   PO intake, Supplement acceptance, Labs, Weight trends, I & O's  REASON FOR ASSESSMENT:   Malnutrition Screening Tool    ASSESSMENT:   76 year old female with PMHx of dementia, anxiety, depression, HLD, hx hip arthroplasty 07/06/2017 who presented from [facility] with AMS found to have UTI, severe dehydration, hypernatremia.   -Following SLP evaluation patient was placed on dysphagia 1 diet with thin liquids.  Met with patient and her family members at bedside. Patient unable to provide any history, so obtained from family. They report patient has had barely anything to eat this past week, and before that had not been eating well for a while. Today she ate some of her lunch (per chart finished 30%). They report patient enjoys orange and chocolate Magic Cup and chocolate or strawberry Ensure.  UBW 125 lbs. Patient had slowly lost weight before her fall in August. She was 112.6 lbs 07/06/2017. She has lost 14.7 lbs (13.1% body weight) over the past 4 months, which is significant for time frame.  Meal Completion: bites to 30%  Medications reviewed and include: Remeron 7.5 mg QHS, potassium chloride 40 mEq once today, ceftriaxone, D5-1/4NS at 125 mL/hr.  Labs reviewed: Sodium 157 (trending down from 161), Potassium 3.1, Chloride 128, BUN 59.  NUTRITION -  FOCUSED PHYSICAL EXAM:    Most Recent Value  Orbital Region  Severe depletion  Upper Arm Region  Severe depletion  Thoracic and Lumbar Region  Unable to assess  Buccal Region  Severe depletion  Temple Region  Severe depletion  Clavicle Bone Region  Severe depletion  Clavicle and Acromion Bone Region  Unable to assess  Scapular Bone Region  Severe depletion  Dorsal Hand  Severe depletion  Patellar Region  Severe depletion  Anterior Thigh Region  Severe depletion  Posterior Calf Region  Severe depletion  Edema (RD Assessment)  None  Hair  Reviewed  Eyes  Unable to assess  Mouth  Unable to assess  Skin  Reviewed  Nails  Reviewed     Diet Order:  DIET - DYS 1 Room service appropriate? Yes with Assist; Fluid consistency: Thin  EDUCATION NEEDS:   No education needs have been identified at this time  Skin:  Skin Assessment: Skin Integrity Issues: Skin Integrity Issues:: Stage I Stage I: sacrum  Last BM:  Unknown  Height:   Ht Readings from Last 1 Encounters:  11/12/17 _0  (1.626 m)    Weight:   Wt Readings from Last 1 Encounters:  11/12/17 97 lb 14.4 oz (44.4 kg)    Ideal Body Weight:  54.5 kg  BMI:  Body mass index is 16.8 kg/m.  Estimated Nutritional Needs:   Kcal:  1330-1550 (30-35 kcal/kg)  Protein:  67-80 grams (1.5-1.8 grams/kg)  Fluid:  1.3-1.5 L/day (1 mL/kcal)  Willey Blade, MS, RD, LDN Office: [phone] Pager: [phone] After Hours/Weekend Pager: [phone]

## 2017-11-13 NOTE — Plan of Care (Signed)
  Progressing Education: Knowledge of General Education information will improve 11/13/2017 1937 - Progressing by Luretha MurphyMiles, Paysen Goza, RN Health Behavior/Discharge Planning: Ability to manage health-related needs will improve 11/13/2017 1937 - Progressing by Luretha MurphyMiles, Deauna Yaw, RN Clinical Measurements: Ability to maintain clinical measurements within normal limits will improve 11/13/2017 1937 - Progressing by Luretha MurphyMiles, Ciearra Rufo, RN Will remain free from infection 11/13/2017 1937 - Progressing by Luretha MurphyMiles, Maralyn Witherell, RN Diagnostic test results will improve 11/13/2017 1937 - Progressing by Luretha MurphyMiles, Mikalyn Hermida, RN Respiratory complications will improve 11/13/2017 1937 - Progressing by Luretha MurphyMiles, Vimal Derego, RN Cardiovascular complication will be avoided 11/13/2017 1937 - Progressing by Luretha MurphyMiles, Malicia Blasdel, RN Activity: Risk for activity intolerance will decrease 11/13/2017 1937 - Progressing by Luretha MurphyMiles, Rejeana Fadness, RN Nutrition: Adequate nutrition will be maintained 11/13/2017 1937 - Progressing by Luretha MurphyMiles, Alle Difabio, RN Coping: Level of anxiety will decrease 11/13/2017 1937 - Progressing by Luretha MurphyMiles, Yzabella Crunk, RN Elimination: Will not experience complications related to bowel motility 11/13/2017 1937 - Progressing by Luretha MurphyMiles, Judeth Gilles, RN Will not experience complications related to urinary retention 11/13/2017 1937 - Progressing by Luretha MurphyMiles, Olimpia Tinch, RN Pain Managment: General experience of comfort will improve 11/13/2017 1937 - Progressing by Luretha MurphyMiles, Schyler Counsell, RN Safety: Ability to remain free from injury will improve 11/13/2017 1937 - Progressing by Luretha MurphyMiles, Khiana Camino, RN Skin Integrity: Risk for impaired skin integrity will decrease 11/13/2017 1937 - Progressing by Luretha MurphyMiles, Carmine Carrozza, RN

## 2017-11-13 NOTE — Clinical Social Work Note (Signed)
CS[facility] spoke to [facility], [facility]  Plan for her to return back to SNF, once she is medically ready for discharge and orders have been received.  Ervin KnackEric R. Reianna Batdorf, MSW, Theresia MajorsLCSWA [phone]  11/13/2017 10:04 AM

## 2017-11-13 NOTE — Telephone Encounter (Signed)
Sent to hospital and admitted Will follow status there

## 2017-11-13 NOTE — NC FL2 (Signed)
[place] MEDICAID FL2 LEVEL OF CARE SCREENING TOOL     IDENTIFICATION  Patient Name: Donna Moss Birthdate: [birth year] Sex: female Admission Date (Current Location): 11/12/2017  [place]   Facility and Address:  [facility], [address], [place], [place] [address]      Provider Number: [id number]  Attending Physician Name and Address:  Houston SirenSainani, Vivek J, MD  Relative Name and Phone Number:  Loren RacerWalker,Tonja Daughter [phone]     Current Level of Care: Hospital Recommended Level of Care: Skilled Nursing Facility Prior Approval Number:    Date Approved/Denied:   PASRR Number: [id number]  Discharge Plan: SNF    Current Diagnoses: Patient Active Problem List   Diagnosis Date Noted  . Pressure injury of skin 11/13/2017  . UTI (urinary tract infection) 11/12/2017  . Hypernatremia 11/12/2017  . Anxiety 11/12/2017  . AKI (acute kidney injury) (HCC) 11/12/2017  . Closed left hip fracture (HCC) 07/05/2017  . HLD (hyperlipidemia) 07/05/2017  . Dementia 07/05/2017    Orientation RESPIRATION BLADDER Height & Weight     Self  Normal Incontinent Weight: 97 lb 14.4 oz (44.4 kg) Height:  5\' 4"  (162.6 cm)  BEHAVIORAL SYMPTOMS/MOOD NEUROLOGICAL BOWEL NUTRITION STATUS      Continent Diet  AMBULATORY STATUS COMMUNICATION OF NEEDS Skin   Total Care Verbally PU Stage and Appropriate Care PU Stage 1 Dressing: (PRN)                     Personal Care Assistance Level of Assistance  Total care           Functional Limitations Info  Sight, Hearing, Speech Sight Info: Adequate Hearing Info: Adequate Speech Info: Adequate    SPECIAL CARE FACTORS FREQUENCY                       Contractures Contractures Info: Not present    Additional Factors Info  Code Status, Allergies, Psychotropic Code Status Info: DNR Allergies Info: Penicillins Psychotropic Info: clonazepam (KLONOPIN) disintegrating  tablet 0.125 mg and clonazepam (KLONOPIN) disintegrating tablet 0.25 mg  or PARoxetine (PAXIL) tablet 20 mg         Current Medications (11/13/2017):  This is the current hospital active medication list Current Facility-Administered Medications  Medication Dose Route Frequency Provider Last Rate Last Dose  . acetaminophen (TYLENOL) tablet 650 mg  650 mg Oral Q6H PRN Oralia ManisWillis, David, MD       Or  . acetaminophen (TYLENOL) suppository 650 mg  650 mg Rectal Q6H PRN Oralia ManisWillis, David, MD      . cefTRIAXone (ROCEPHIN) 2 g in dextrose 5 % 50 mL IVPB  2 g Intravenous Q24H Oralia ManisWillis, David, MD      . Melene Muller[START ON 11/14/2017] clonazepam (KLONOPIN) disintegrating tablet 0.125 mg  0.125 mg Oral Daily Sainani, Rolly PancakeVivek J, MD       And  . clonazepam (KLONOPIN) disintegrating tablet 0.25 mg  0.25 mg Oral QPM Sainani, Vivek J, MD      . dextrose 5 % and 0.2 % NaCl infusion   Intravenous Continuous Oralia ManisWillis, David, MD 125 mL/hr at 11/13/17 [phone]    . enoxaparin (LOVENOX) injection 30 mg  30 mg Subcutaneous Q24H Oralia ManisWillis, David, MD      . feeding supplement (ENSURE ENLIVE) (ENSURE ENLIVE) liquid 237 mL  237 mL Oral BID BM Sainani, Rolly PancakeVivek J, MD      . MEDLINE mouth rinse  15 mL Mouth Rinse BID  Oralia ManisWillis, David, MD      . mirtazapine (REMERON) tablet 7.5 mg  7.5 mg Oral Lamont SnowballQHS Willis, David, MD      . ondansetron [facility](ZOFRAN) tablet 4 mg  4 mg Oral Q6H PRN Oralia ManisWillis, David, MD       Or  . ondansetron [facility](ZOFRAN) injection 4 mg  4 mg Intravenous Q6H PRN Oralia ManisWillis, David, MD      . PARoxetine (PAXIL) tablet 20 mg  20 mg Oral Daily Oralia ManisWillis, David, MD   20 mg at 11/13/17 0916  . potassium chloride (KLOR-CON) packet 40 mEq  40 mEq Oral Once Houston SirenSainani, Vivek J, MD         Discharge Medications: Please see discharge summary for a list of discharge medications.  Relevant Imaging Results:  Relevant Lab Results:   Additional Information SSN [id number](SSN [id number])  Darleene CleaverAnterhaus, Rodrick Payson R, LCSWA

## 2017-11-13 NOTE — Evaluation (Signed)
Clinical/Bedside Swallow Evaluation Patient Details  Name: Donna AuerbachKarin E Moss MRN: [id number] Date of Birth: 1941/04/09  Today's Date: 11/13/2017 Time: SLP Start Time (ACUTE ONLY): 0910 SLP Stop Time (ACUTE ONLY): 1010 SLP Time Calculation (min) (ACUTE ONLY): 60 min  Past Medical History:  Past Medical History:  Diagnosis Date  . Anxiety   . Dementia   . Depression   . HLD (hyperlipidemia)    Past Surgical History:  Past Surgical History:  Procedure Laterality Date  . COLONOSCOPY    . HIP ARTHROPLASTY Left 07/06/2017   Procedure: ARTHROPLASTY BIPOLAR HIP (HEMIARTHROPLASTY);  Surgeon: Kennedy BuckerMenz, Michael, MD;  Location: [facility] ORS;  Service: Orthopedics;  Laterality: Left;   HPI:  Pt is a 76 y.o. female w/ PMH of Dementia, anxiety/depression who presents with couple days of increased lethargy.  Patient has dementia at baseline and family who is at bedside today provides HPI.  They state that patient's baseline functioning is verbal, but typically confused.  For the past couple of days she has been more lethargic and not really talking much at all.  She is also not been eating or drinking much.  Here in the ED UA was suspicious for UTI.  Her sodium level was significantly elevated at 16o and she had a little bit of AKI. Daughter stated pt has a baseline of chronic UTIs. She also stated pt's had to be transitioned to long term care d/t the increased Dementia behaviors and sundowning. Pt's verbal communication has declined per their report.    Assessment / Plan / Recommendation Clinical Impression  Pt appears to present w/ min oropharyngeal phase dysphagia impacted primarily by her Cognitive status, decline d/t Dementia. Pt's does present w/ Oral phase deficits for coordination and awareness when accepting po boluses fed to her; min confusion re: timing of opening mouth to be fed, sipping from straw. Pt would benefit from a more modified diet to a Puree w/ thin liquids to decrease the work of the food  in order to allow for easier overall intake safely. Pt exhibited no significant, overt s/s of aspiration w/ trials of thin liquids and purees; oral phase management and clearing of the bolus material was wfl given time. Recommend f/u at SNF for trials to upgrade diet to a Minced food consistency when pt's medical status (UTI, high Sodium level) has improved and Cognitive status can improve somewhat. NSG updated.  SLP Visit Diagnosis: Dysphagia, oropharyngeal phase (R13.12)    Aspiration Risk  Mild aspiration risk(d/t Cognition)    Diet Recommendation  Dysphagia level 1 (puree) w/ Thin liquids; general aspiration precautions; feeding support.   Medication Administration: Crushed with puree    Other  Recommendations Recommended Consults: (Dietician f/u) Oral Care Recommendations: Oral care BID;Staff/trained caregiver to provide oral care   Follow up Recommendations Skilled Nursing facility      Frequency and Duration (n/a)  (n/a)       Prognosis Prognosis for Safe Diet Advancement: Fair(-good) Barriers to Reach Goals: Cognitive deficits;Time post onset;Severity of deficits      Swallow Study   General Date of Onset: 11/12/17 HPI: Pt is a 76 y.o. female w/ PMH of Dementia, anxiety/depression who presents with couple days of increased lethargy.  Patient has dementia at baseline and family who is at bedside today provides HPI.  They state that patient's baseline functioning is verbal, but typically confused.  For the past couple of days she has been more lethargic and not really talking much at all.  She is also not been  eating or drinking much.  Here in the ED UA was suspicious for UTI.  Her sodium level was significantly elevated at 16o and she had a little bit of AKI. Daughter stated pt has a baseline of chronic UTIs. She also stated pt's had to be transitioned to long term care d/t the increased Dementia behaviors and sundowning. Pt's verbal communication has declined per their report.   Type of Study: Bedside Swallow Evaluation Previous Swallow Assessment: none reported Diet Prior to this Study: Dysphagia 3 (soft);Thin liquids("soft foods") Temperature Spikes Noted: No(wbc 12.3) Respiratory Status: Room air History of Recent Intubation: No Behavior/Cognition: Cooperative;Pleasant mood;Confused;Distractible;Requires cueing;Doesn't follow directions(awake) Oral Cavity Assessment: Within Functional Limits Oral Care Completed by SLP: Recent completion by staff Oral Cavity - Dentition: Edentulous Vision: (unknown) Self-Feeding Abilities: Total assist Patient Positioning: Upright in bed Baseline Vocal Quality: Low vocal intensity(mumbled/muttered speech) Volitional Cough: Cognitively unable to elicit Volitional Swallow: Unable to elicit    Oral/Motor/Sensory Function Overall Oral Motor/Sensory Function: (appeared wfl for bolus management, clearing)   Ice Chips Ice chips: Not tested   Thin Liquid Thin Liquid: Impaired Presentation: Straw(fed; several sips; 7-10 total (some multiple sips)) Oral Phase Impairments: Poor awareness of bolus(min confusion when opening mouth to accept boluses) Oral Phase Functional Implications: (grossly wfl for bolus management and clearing) Pharyngeal  Phase Impairments: (mild cough x1 initially when taking multiple swallows)    Nectar Thick Nectar Thick Liquid: Not tested   Honey Thick Honey Thick Liquid: Not tested   Puree Puree: Impaired Presentation: Spoon(fed; ~2-3 ozs total) Oral Phase Impairments: (min increased time but grossly wfl) Pharyngeal Phase Impairments: (none)   Solid   GO   Solid: Not tested Other Comments: d/t pt's Cognitive decline in at this time; Dtr stated pt coughed this morning while attempting to eat scrambled eggs    Functional Assessment Tool Used: clinical judgement Functional Limitations: Swallowing Swallow Current Status (O9629(G8996): At least 20 percent but less than 40 percent impaired, limited or  restricted Swallow Goal Status [phone](G8997): At least 20 percent but less than 40 percent impaired, limited or restricted Swallow Discharge Status [phone](G8998): At least 20 percent but less than 40 percent impaired, limited or restricted    Jerilynn SomKatherine Belvin Gauss, MS, CCC-SLP Jean Skow 11/13/2017,12:04 PM

## 2017-11-13 NOTE — Plan of Care (Signed)
VSS, free of falls during shift.  Q2h turns for skin integrity, transient grimacing, no interventions needed.  Troponin, Na stable.  K 3.1, Dr. Sheryle Hailiamond paged, no new orders.  Daughter oriented to unit, pt disoriented x4.  Bed in low position, call bell within reach.  WCTM.

## 2017-11-13 NOTE — Telephone Encounter (Signed)
PLEASE NOTE: All timestamps contained within this report are represented as [place] Standard Time. CONFIDENTIALTY NOTICE: This fax transmission is intended only for the addressee. It contains information that is legally privileged, confidential or otherwise protected from use or disclosure. If you are not the intended recipient, you are strictly prohibited from reviewing, disclosing, copying using or disseminating any of this information or taking any action in reliance on or regarding this information. If you have received this fax in error, please notify us immediately by telephone so that we can arrange for its return to us. Phone: [phone], Toll-Free: [phone], Fax: [phone] Page: 1 of 1 Call Id: [id number] [facility] - Client Nonclinical Telephone Record [facility] Client [facility] - Night Physician Tillman AbideLetvak, Richard - MD Contact Type Call Who Is Calling Physician / Provider / Hospital Call Type [facility] Reason for Call Request for Orders Initial Comment [facility] needs orders for a pt Additional Comment Patient Name Donna PierKaren Moss Patient DOB 08/13/1941 [facility] Physician Number [phone] Facility [facility] Paging DoctorName Phone DateTime Result/Outcome Message Type Notes Tana ConchHunter, Stephen- MD [phone] 11/12/2017 3:14:05 PM Called On Call Provider - Reached Doctor Paged Tana ConchHunter, Stephen- MD 11/12/2017 3:14:29 PM Spoke with On Call - General Message Result Call Closed By: Jackelyn HoehnLorena Gonzalez Transaction Date/Time: 11/12/2017 3:00:49 PM (ET)

## 2017-11-14 LAB — BASIC METABOLIC PANEL
ANION GAP: 4 — AB (ref 5–15)
BUN: 28 mg/dL — ABNORMAL HIGH (ref 6–20)
CALCIUM: 8.6 mg/dL — AB (ref 8.9–10.3)
CO2: 25 mmol/L (ref 22–32)
Chloride: 123 mmol/L — ABNORMAL HIGH (ref 101–111)
Creatinine, Ser: 0.61 mg/dL (ref 0.44–1.00)
Glucose, Bld: 144 mg/dL — ABNORMAL HIGH (ref 65–99)
Potassium: 3.3 mmol/L — ABNORMAL LOW (ref 3.5–5.1)
SODIUM: 152 mmol/L — AB (ref 135–145)

## 2017-11-14 MED ORDER — POTASSIUM CHLORIDE 20 MEQ PO PACK
40.0000 meq | PACK | Freq: Once | ORAL | Status: AC
  Administered 2017-11-14: 19:00:00 40 meq via ORAL
  Filled 2017-11-14: qty 2

## 2017-11-14 MED ORDER — BISACODYL 10 MG RE SUPP
10.0000 mg | Freq: Once | RECTAL | Status: AC
  Administered 2017-11-14: 10 mg via RECTAL
  Filled 2017-11-14: qty 1

## 2017-11-14 MED ORDER — DEXTROSE 5 % IV SOLN
INTRAVENOUS | Status: AC
  Administered 2017-11-14 – 2017-11-15 (×3): via INTRAVENOUS

## 2017-11-14 NOTE — Evaluation (Signed)
Physical Therapy Evaluation Patient Details Name: Donna Moss MRN: [id number] DOB: Mar 03, 1941 Today's Date: 11/14/2017   History of Present Illness  Pt admitted for hyponatremia and dehydration. Pt with complaints of AMS and lethargy. History includes dementia, anxiety, and depression. Pt is poor historian and non-verbal during time of evaluation. History obtained from family in room.  Clinical Impression  Pt is a pleasant 76 year old female who was admitted for hyponatremia and dehydration. Pt performs bed mobility with max assist and needed +2 for scooting back up in bed. Pt is non-verbal and per daughter has recently resided in bed most of the day. Of note, pt with head turned to L side, able to turn to neutral position, however unable to follow commands for R turn, daughter aware. Unable to participate in therapy services due to advanced dementia. Pt does not require any further PT needs at this time. Pt will be dc in house and does not require follow up. RN aware. Will dc current orders. [facility].      Follow Up Recommendations No PT follow up    Equipment Recommendations  None recommended by PT    Recommendations for Other Services       Precautions / Restrictions Precautions Precautions: Fall Restrictions Weight Bearing Restrictions: No      Mobility  Bed Mobility Overal bed mobility: Needs Assistance Bed Mobility: Supine to Sit     Supine to sit: Max assist     General bed mobility comments: needed heavy assist for coming to sit at EOB. Pt with poor balance, unable to maintain independently. Unable to further progress activity  Transfers                    Ambulation/Gait                Stairs            Wheelchair Mobility    Modified Rankin (Stroke Patients Only)       Balance Overall balance assessment: History of Falls                                           Pertinent  Vitals/Pain Pain Assessment: No/denies pain    Home Living Family/patient expects to be discharged to:: ([facility])                      Prior Function Level of Independence: Needs assistance         Comments: Per daughter, pt needed assist for mobility, however has not used AD. Recently very limited functional mobility noted and has mostly stayed in bed.     Hand Dominance        Extremity/Trunk Assessment   Upper Extremity Assessment Upper Extremity Assessment: Difficult to assess due to impaired cognition    Lower Extremity Assessment Lower Extremity Assessment: Difficult to assess due to impaired cognition(moves extremities sponateneously, unable to follow commands)       Communication   Communication: No difficulties  Cognition Arousal/Alertness: Awake/alert Behavior During Therapy: WFL for tasks assessed/performed Overall Cognitive Status: History of cognitive impairments - at baseline  General Comments      Exercises     Assessment/Plan    PT Assessment Patent does not need any further PT services  PT Problem List         PT Treatment Interventions      PT Goals (Current goals can be found in the Care Plan section)  Acute Rehab PT Goals Patient Stated Goal: unable to state, per family pt will return to [facility] PT Goal Formulation: All assessment and education complete, DC therapy Time For Goal Achievement: 11/14/17 Potential to Achieve Goals: Poor    Frequency     Barriers to discharge        Co-evaluation               AM-PAC PT "6 Clicks" Daily Activity  Outcome Measure Difficulty turning over in bed (including adjusting bedclothes, sheets and blankets)?: Unable Difficulty moving from lying on back to sitting on the side of the bed? : Unable Difficulty sitting down on and standing up from a chair with arms (e.g., wheelchair, bedside commode, etc,.)?:  Unable Help needed moving to and from a bed to chair (including a wheelchair)?: Total Help needed walking in hospital room?: Total Help needed climbing 3-5 steps with a railing? : Total 6 Click Score: 6    End of Session   Activity Tolerance: Treatment limited secondary to medical complications (Comment) Patient left: in bed;with bed alarm set;with family/visitor present Nurse Communication: Mobility status PT Visit Diagnosis: History of falling (Z91.81);Muscle weakness (generalized) (M62.81);Difficulty in walking, not elsewhere classified (R26.2)    Time: 1045-1100 PT Time Calculation (min) (ACUTE ONLY): 15 min   Charges:   PT Evaluation $PT Eval Low Complexity: 1 Low     PT G Codes:   PT G-Codes **NOT FOR INPATIENT CLASS** Functional Assessment Tool Used: AM-PAC 6 Clicks Basic Mobility Functional Limitation: Mobility: Walking and moving around Mobility: Walking and Moving Around Current Status (Q6578(G8978): 100 percent impaired, limited or restricted Mobility: Walking and Moving Around Goal Status (I6962(G8979): 100 percent impaired, limited or restricted Mobility: Walking and Moving Around Discharge Status (X5284(G8980): 100 percent impaired, limited or restricted    Elizabeth PalauStephanie Rossy Virag, PT, DPT [phone]   Elzie Sheets 11/14/2017, 3:06 PM

## 2017-11-14 NOTE — Progress Notes (Signed)
[employer] [facility] - [place] [facility]   PATIENT NAME: Donna Moss    MR#:  [id number]  DATE OF BIRTH:  17-Oct-1941  SUBJECTIVE:   Patient here due to altered mental status and noted to have a urinary tract infection along with severe dehydration and hypernatremia.   Sodium improved but mental status waxes and wanes.    REVIEW OF SYSTEMS:    Review of Systems  Unable to perform ROS: Dementia    Nutrition: Dysphagia 1 with thin liquids Tolerating Diet: Yes Tolerating PT: bedbound at baseline     DRUG ALLERGIES:   Allergies  Allergen Reactions  . Penicillins Other (See Comments)    Has patient had a PCN reaction causing immediate rash, facial/tongue/throat swelling, SOB or lightheadedness with hypotension: Unknown Has patient had a PCN reaction causing severe rash involving mucus membranes or skin necrosis: Unknown Has patient had a PCN reaction that required hospitalization: Unknown Has patient had a PCN reaction occurring within the last 10 years: Unknown If all of the above answers are "NO", then may proceed with Cephalosporin use.     VITALS:  Blood pressure (!) 108/54, pulse 78, temperature 98.2 F (36.8 C), temperature source Oral, resp. rate 18, height 5\' 4"  (1.626 m), weight 44.4 kg (97 lb 14.4 oz), SpO2 95 %.  PHYSICAL EXAMINATION:   Physical Exam  GENERAL:  76 y.o.-year-old patient lying in bed confused, lethargic but in NAD.  EYES: Pupils equal, round, reactive to light. No scleral icterus. Extraocular muscles intact.  HEENT: Head atraumatic, normocephalic. Oropharynx and nasopharynx clear. Moist Oral mucosa NECK:  Supple, no jugular venous distention. No thyroid enlargement, no tenderness.  LUNGS: Normal breath sounds bilaterally, no wheezing, rales, rhonchi. No use of accessory muscles of respiration.  CARDIOVASCULAR: S1, S2 normal. No murmurs, rubs, or gallops.  ABDOMEN: Soft, nontender, nondistended. Bowel sounds present. No  organomegaly or mass.  EXTREMITIES: No cyanosis, clubbing or edema b/l.    NEUROLOGIC: Cranial nerves II through XII are intact. No focal Motor or sensory deficits b/l.  Globally weak PSYCHIATRIC: The patient is alert and oriented x 1.  SKIN: No obvious rash, lesion, or ulcer.    LABORATORY PANEL:   CBC Recent Labs  Lab 11/13/17 0417  WBC 12.3*  HGB 13.5  HCT 40.3  PLT 254   ------------------------------------------------------------------------------------------------------------------  Chemistries  Recent Labs  Lab 11/12/17 1740  11/14/17 0408  NA 160*   < > 152*  K 3.4*   < > 3.3*  CL 124*   < > 123*  CO2 23   < > 25  GLUCOSE 146*   < > 144*  BUN 70*   < > 28*  CREATININE 1.27*   < > 0.61  CALCIUM 9.9   < > 8.6*  AST 61*  --   --   ALT 126*  --   --   ALKPHOS 57  --   --   BILITOT 1.9*  --   --    < > = values in this interval not displayed.   ------------------------------------------------------------------------------------------------------------------  Cardiac Enzymes Recent Labs  Lab 11/13/17 0417  TROPONINI 0.03*   ------------------------------------------------------------------------------------------------------------------  RADIOLOGY:  Ct Head Wo Contrast  Result Date: 11/12/2017 CLINICAL DATA:  Change in mental status x1 week EXAM: CT HEAD WITHOUT CONTRAST TECHNIQUE: Contiguous axial images were obtained from the base of the skull through the vertex without intravenous contrast. COMPARISON:  None. FINDINGS: BRAIN: There is sulcal and ventricular prominence consistent with superficial and central atrophy.  No intraparenchymal hemorrhage, mass effect nor midline shift. Periventricular and subcortical white matter hypodensities consistent with minimal chronic small vessel ischemic disease are identified. No acute large vascular territory infarcts. No abnormal extra-axial fluid collections. Basal cisterns are not effaced and midline. VASCULAR: Moderate  calcific atherosclerosis of the carotid siphons. SKULL: No skull fracture. No significant scalp soft tissue swelling. SINUSES/ORBITS: The mastoid air-cells are clear. The included paranasal sinuses are well-aerated.The included ocular globes and orbital contents are non-suspicious. OTHER: None. IMPRESSION: Atrophy with small vessel ischemic disease. No acute intracranial abnormality. Electronically Signed   By: Tollie Ethavid  Kwon M.D.   On: 11/12/2017 20:34   Dg Chest Portable 1 View  Result Date: 11/12/2017 CLINICAL DATA:  Mental status change x1 week. Advanced Alzheimer's dementia. EXAM: PORTABLE CHEST 1 VIEW COMPARISON:  07/05/2017 FINDINGS: Top-normal size heart. Moderate aortic atherosclerosis at the arch and descending aorta without aneurysm. Emphysematous hyperinflation of the lungs with chronic atelectasis and/or minimal scarring at the left lung base. No pneumonic consolidation, effusion or pneumothorax. No acute nor suspicious osseous abnormalities. Extracranial carotid arteriosclerosis is noted bilaterally of the included neck. IMPRESSION: Mild hyperinflation of the lungs with chronic left basilar atelectasis and/or scarring. Aortic atherosclerosis. Electronically Signed   By: Tollie Ethavid  Kwon M.D.   On: 11/12/2017 20:12     ASSESSMENT AND PLAN:   76 year old female with past medical history advanced dementia, hypertension, hyperlipidemia, anxiety/depression who presented to the hospital due to altered mental status.  1. Altered mental status-metabolic encephalopathy secondary to severe hypernatremia combined with underlying UTI. -Continue IV fluids and will change to d5W to treat the hypernatremia, continue IV Ceftriaxone for the UTI. Follow mental status which is improving.  2. Hypernatremia-secondary to severe dehydration and poor by mouth intake. -Continue D5W, follow sodium which is improving.  3. Urinary tract infection-patient's urinalysis is not that grossly positive. Urine cultures going  50,000 colonies of gram-negative rod but sensitivities pending.  - Continue per ceftriaxone, we'll treat for a total of 5 days.  4. Acute kidney injury-secondary to dehydration, continue IV fluid hydration. Creatinine normalized now.   5. Anxiety/depression-continue Klonopin, Paxil, Remeron.   Will d/c to SNF tomorrow once sodium has improved.   All the records are reviewed and case discussed with Care Management/Social Worker. Management plans discussed with the patient, family and they are in agreement.  CODE STATUS: DO NOT RESUSCITATE  DVT Prophylaxis: Lovenox  TOTAL TIME TAKING CARE OF THIS PATIENT: 30 minutes.   POSSIBLE D/C IN 1-2 DAYS, DEPENDING ON CLINICAL CONDITION.   [place] SirenSAINANI,VIVEK J M.D on 11/14/2017 at 2:11 PM  Between 7am to 6pm - Pager - [phone]  After 6pm go to [email or web address] - Social research officer, governmentpassword EPAS [facility]  [facility]  Office  [phone]  CC: Primary care physician; Karie SchwalbeLetvak, Richard I, MD

## 2017-11-14 NOTE — Clinical Social Work Note (Signed)
Clinical Social Work Assessment  Patient Details  Name: Donna AuerbachKarin E Buskey MRN: [id number] Date of Birth: [birth year]  Date of referral:  11/13/17               Reason for consult:  Facility Placement                Permission sought to share information with:  Facility Medical sales representativeContact Representative, Family Supports Permission granted to share information::  Yes, Verbal Permission Granted  Name::     Loren RacerWalker,Tonja Daughter [phone]   Agency::  SNF admissions  Relationship::     Contact Information:     Housing/Transportation Living arrangements for the past 2 months:  Skilled Nursing Facility Source of Information:  Medical Team Patient Interpreter Needed:  None Criminal Activity/Legal Involvement Pertinent to Current Situation/Hospitalization:  No - Comment as needed Significant Relationships:  Adult Children Lives with:  Facility Resident Do you feel safe going back to the place where you live?  Yes Need for family participation in patient care:  Yes (Comment)  Care giving concerns:  Patient plans to return back to SNF.   Social Worker assessment / plan:  Patient is a 76 year old female who is a long term care resident from [facility].  CSW completed assessment by reviewing patient's chart due to patient's dementia, and no family available to speak with.  CSW spoke to SNF about patient, she has some dementia, and has been at [facility] for several months.  Patient is not really able to complete any tasks on her own, she is total assist for everything.  Patient's family did not report any concern to bedside nurse.  CSW to fax [facility] to [facility].  Employment status:  Retired Health and safety inspectornsurance information:  Medicare PT Recommendations:  No Follow Up Information / Referral to community resources:  Skilled Nursing Facility  Patient/Family's Response to care:  Patient's family plans to have her return back to [facility].  Patient/Family's Understanding of and Emotional Response to  Diagnosis, Current Treatment, and Prognosis: Patient is not aware of current treatment plan due to dementia.  Emotional Assessment Appearance:  Appears stated age Attitude/Demeanor/Rapport:    Affect (typically observed):  Calm, Stable Orientation:  Oriented to Self Alcohol / Substance use:  Not Applicable Psych involvement (Current and /or in the community):     Discharge Needs  Concerns to be addressed:  Care Coordination Readmission within the last 30 days:  No Current discharge risk:  Cognitively Impaired Barriers to Discharge:  No Barriers Identified   Donna Moss, Donna Moss R, LCSWA 11/14/2017, 7:11 PM

## 2017-11-14 NOTE — Progress Notes (Addendum)
  Speech Language Pathology Treatment: Dysphagia  Patient Details Name: Donna Moss MRN: [id number] DOB: September 23, 1941 Today's Date: 11/14/2017 Time: 4235-3614 SLP Time Calculation (min) (ACUTE ONLY): 40 min  Assessment / Plan / Recommendation Clinical Impression  Pt seen today for ongoing assessment of toleration of diet and education w/ Daughter present today. Pt consumed few trials of puree and thin liquids this morning w/ no overt s/s of aspiration noted but has been sleeping per Daughter. For this session, education and discussion w/ Daughter was had d/t being able to meet w/ SLP re: recommendations for pt at this time. Discussedaspiration precautions; support at meals to facilitate eating/drinking; food prep and options d/t Cognitive decline/Dementia and insufficient dentition for mastication; dysphagia and impact of Neurological dis/Dementia on swallowingoverall. Handouts and information given to Daughter on diet consistency and preparation of the foods to a puree consistency; use of condiments to moisten and flavor foods; Cream soups to moisten foods as well as for easier swallowing and intake. Recommend f/u w/ Dietician for supplements to aid nutrition. Recommend Pills in Puree (crushed if indicated) for easier, safer swallowing. Recommend feeding assistance at meals - monitor for fatigue and pace pt offering smaller, frequent meals - reduce distractions at meals. The diet consistency recommended would be most beneficial for pt's overall (safer) intake secondary to the degree of her Cognitive decline, reduce risk of choking on unchewed foods, and conservation of energy at this time. ST services can be reconsulted if indicated while admitted. Daughter agreed;NSG updated and agreed. Palliative Care could be recommended for consult for goals of care. Dietician f/u indicated to address nutritional needs.     HPI HPI: Pt is a 76 y.o. female w/ PMH of Dementia, anxiety/depression who presents  with couple days of increased lethargy.  Patient has dementia at baseline and family who is at bedside today provides HPI.  They state that patient's baseline functioning is verbal, but typically confused.  For the past couple of days she has been more lethargic and not really talking much at all.  She is also not been eating or drinking much.  Here in the ED UA was suspicious for UTI.  Her sodium level was significantly elevated at 16o and she had a little bit of AKI. Daughter stated pt has a baseline of chronic UTIs. She also stated pt's had to be transitioned to long term care d/t the increased Dementia behaviors and sundowning. Pt's verbal communication has declined per their report.       SLP Plan  All goals met       Recommendations  Diet recommendations: Dysphagia 1 (puree);Thin liquid Liquids provided via: Cup;Straw(monitor ) Medication Administration: Crushed with puree Supervision: Full supervision/cueing for compensatory strategies;Staff to assist with self feeding Compensations: Minimize environmental distractions;Slow rate;Small sips/bites;Lingual sweep for clearance of pocketing;Multiple dry swallows after each bite/sip;Follow solids with liquid Postural Changes and/or Swallow Maneuvers: Seated upright 90 degrees;Upright 30-60 min after meal                General recommendations: (Dietician f/u) Oral Care Recommendations: Oral care BID;Staff/trained caregiver to provide oral care Follow up Recommendations: Skilled Nursing facility SLP Visit Diagnosis: Dysphagia, oropharyngeal phase (R13.12) Plan: All goals met       [place], MS, CCC-SLP Watson,Katherine 11/14/2017, 5:47 PM

## 2017-11-15 DIAGNOSIS — E43 Unspecified severe protein-calorie malnutrition: Secondary | ICD-10-CM

## 2017-11-15 LAB — BASIC METABOLIC PANEL
ANION GAP: 4 — AB (ref 5–15)
BUN: 19 mg/dL (ref 6–20)
CHLORIDE: 114 mmol/L — AB (ref 101–111)
CO2: 25 mmol/L (ref 22–32)
CREATININE: 0.59 mg/dL (ref 0.44–1.00)
Calcium: 8.4 mg/dL — ABNORMAL LOW (ref 8.9–10.3)
GFR calc non Af Amer: >60 mL/min (ref >60)
Glucose, Bld: 116 mg/dL — ABNORMAL HIGH (ref 65–99)
POTASSIUM: 3.7 mmol/L (ref 3.5–5.1)
SODIUM: 143 mmol/L (ref 135–145)

## 2017-11-15 LAB — URINE CULTURE: Culture: 50000 — AB

## 2017-11-15 MED ORDER — POTASSIUM CHLORIDE 20 MEQ PO PACK
40.0000 meq | PACK | Freq: Once | ORAL | Status: AC
  Administered 2017-11-15: 13:00:00 40 meq via ORAL
  Filled 2017-11-15: qty 2

## 2017-11-15 MED ORDER — CEFUROXIME AXETIL 250 MG PO TABS: 250.0000 mg | ORAL_TABLET | Freq: Two times a day (BID) | ORAL | 0 refills | Status: AC

## 2017-11-15 NOTE — Progress Notes (Signed)
Report called to [place] at [facility]. EMS notified of transport need.

## 2017-11-15 NOTE — Discharge Summary (Signed)
[facility] - [place] at [facility]   PATIENT NAME: Donna Moss    MR#:  [id number]  DATE OF BIRTH:  08/16/1941  DATE OF ADMISSION:  11/12/2017 ADMITTING PHYSICIAN: Oralia Manisavid Willis, MD  DATE OF DISCHARGE: 11/15/2017  PRIMARY CARE PHYSICIAN: Karie SchwalbeLetvak, Richard I, MD    ADMISSION DIAGNOSIS:  Acute hypernatremia [E87.0] Acute cystitis without hematuria [N30.00] Acute encephalopathy [G93.40]  DISCHARGE DIAGNOSIS:  Principal Problem:   Hypernatremia Active Problems:   Dementia   UTI (urinary tract infection)   Anxiety   AKI (acute kidney injury) (HCC)   Pressure injury of skin   Protein-calorie malnutrition, severe   SECONDARY DIAGNOSIS:   Past Medical History:  Diagnosis Date  . Anxiety   . Dementia   . Depression   . HLD (hyperlipidemia)     HOSPITAL COURSE:   76 year old female with past medical history advanced dementia, hypertension, hyperlipidemia, anxiety/depression who presented to the hospital due to altered mental status.  1. Altered mental status-this was metabolic encephalopathy secondary to underlying severe hypernatremia along with underlying UTI. After IV fluid hydration and as her sodium is improved and she has been treated for urinary tract infection with IV antibiotics, her mental status is improved and is close to baseline as per the family. She is being discharged back to her skilled nursing facility.  2. Hypernatremia-secondary to severe dehydration and poor by mouth intake. -Patient receives D5W and her sodium has since had improved and normalized now.  3. Urinary tract infection-patient's urinalysis was not that grossly positive on admission. Urine cultures  grew 50,000 colonies of Klebsiella. -It was sensitive to ceftriaxone and patient was maintained on that now being discharged on oral Ceftin for additional 3 days.  4. Acute kidney injury-secondary to dehydration -Patient was hydrated with IV fluids and her renal function  has improved and normalized now.    5. Anxiety/depression- pt. Will continue Klonopin, Paxil, Remeron.   DISCHARGE CONDITIONS:   Stable.   CONSULTS OBTAINED:    DRUG ALLERGIES:   Allergies  Allergen Reactions  . Penicillins Other (See Comments)    Has patient had a PCN reaction causing immediate rash, facial/tongue/throat swelling, SOB or lightheadedness with hypotension: Unknown Has patient had a PCN reaction causing severe rash involving mucus membranes or skin necrosis: Unknown Has patient had a PCN reaction that required hospitalization: Unknown Has patient had a PCN reaction occurring within the last 10 years: Unknown If all of the above answers are "NO", then may proceed with Cephalosporin use.     DISCHARGE MEDICATIONS:   Allergies as of 11/15/2017      Reactions   Penicillins Other (See Comments)   Has patient had a PCN reaction causing immediate rash, facial/tongue/throat swelling, SOB or lightheadedness with hypotension: Unknown Has patient had a PCN reaction causing severe rash involving mucus membranes or skin necrosis: Unknown Has patient had a PCN reaction that required hospitalization: Unknown Has patient had a PCN reaction occurring within the last 10 years: Unknown If all of the above answers are "NO", then may proceed with Cephalosporin use.      Medication List    STOP taking these medications   docusate sodium 100 MG capsule Commonly known as:  COLACE   HYDROcodone-acetaminophen 5-325 MG tablet Commonly known as:  NORCO/VICODIN   traZODone 50 MG tablet Commonly known as:  DESYREL     TAKE these medications   acetaminophen 500 MG tablet Commonly known as:  TYLENOL Take 500 mg by mouth 2 (two) times daily  as needed for mild pain, fever or headache. What changed:  Another medication with the same name was changed. Make sure you understand how and when to take each.   acetaminophen 325 MG tablet Commonly known as:  TYLENOL Take 2 tablets (650  mg total) by mouth every 6 (six) hours as needed for mild pain (or Fever >/= 101). What changed:  when to take this   bisacodyl 10 MG suppository Commonly known as:  DULCOLAX Place 1 suppository (10 mg total) rectally daily as needed for moderate constipation.   cefUROXime 250 MG tablet Commonly known as:  CEFTIN Take 1 tablet (250 mg total) by mouth 2 (two) times daily with a meal for 3 days.   clonazePAM 0.5 MG tablet Commonly known as:  KLONOPIN Take 0.5 tablets (0.25 mg total) by mouth at bedtime as needed for anxiety. What changed:    how much to take  when to take this  additional instructions   Cranberry 425 MG Caps Take 1 capsule by mouth 2 (two) times daily.   enoxaparin 40 MG/0.4ML injection Commonly known as:  LOVENOX Inject 0.4 mLs (40 mg total) into the skin daily.   ergocalciferol 50000 units capsule Commonly known as:  VITAMIN D2 Take 50,000 Units by mouth every 30 (thirty) days.   mirtazapine 15 MG tablet Commonly known as:  REMERON Take 7.5 mg by mouth at bedtime.   PARoxetine 20 MG tablet Commonly known as:  PAXIL Take 20 mg by mouth daily.   polyethylene glycol packet Commonly known as:  MIRALAX / GLYCOLAX Take 17 g by mouth daily.   vitamin B-12 1000 MCG tablet Commonly known as:  CYANOCOBALAMIN Take 1,000 mcg by mouth See admin instructions. Mondays, Wednesday, Fridays         DISCHARGE INSTRUCTIONS:   DIET:  Pureed diet with thin liquids.  Aspiration Precautions.   DISCHARGE CONDITION:  Stable  ACTIVITY:  Activity as tolerated  OXYGEN:  Home Oxygen: No.   Oxygen Delivery: room air  DISCHARGE LOCATION:  nursing home   If you experience worsening of your admission symptoms, develop shortness of breath, life threatening emergency, suicidal or homicidal thoughts you must seek medical attention immediately by calling 911 or calling your MD immediately  if symptoms less severe.  You Must read complete instructions/literature  along with all the possible adverse reactions/side effects for all the Medicines you take and that have been prescribed to you. Take any new Medicines after you have completely understood and accpet all the possible adverse reactions/side effects.   Please note  You were cared for by a hospitalist during your hospital stay. If you have any questions about your discharge medications or the care you received while you were in the hospital after you are discharged, you can call the unit and asked to speak with the hospitalist on call if the hospitalist that took care of you is not available. Once you are discharged, your primary care physician will handle any further medical issues. Please note that NO REFILLS for any discharge medications will be authorized once you are discharged, as it is imperative that you return to your primary care physician (or establish a relationship with a primary care physician if you do not have one) for your aftercare needs so that they can reassess your need for medications and monitor your lab values.     Today   Mental status improved.  Taking some PO. Family at bedside.  Sodium level normalized and will d/c back to  SNF today.   VITAL SIGNS:  Blood pressure 111/63, pulse 68, temperature 97.6 F (36.4 C), temperature source Axillary, resp. rate 18, height 5\' 4"  (1.626 m), weight 44.4 kg (97 lb 14.4 oz), SpO2 100 %.  I/O:    Intake/Output Summary (Last 24 hours) at 11/15/2017 1244 Last data filed at 11/15/2017 0900 Gross per 24 hour  Intake 2051.67 ml  Output -  Net 2051.67 ml    PHYSICAL EXAMINATION:   GENERAL:  76 y.o.-year-old patient lying in bed confused, lethargic but in NAD.  EYES: Pupils equal, round, reactive to light. No scleral icterus. Extraocular muscles intact.  HEENT: Head atraumatic, normocephalic. Oropharynx and nasopharynx clear. Moist Oral mucosa NECK:  Supple, no jugular venous distention. No thyroid enlargement, no tenderness.   LUNGS: Normal breath sounds bilaterally, no wheezing, rales, rhonchi. No use of accessory muscles of respiration.  CARDIOVASCULAR: S1, S2 normal. No murmurs, rubs, or gallops.  ABDOMEN: Soft, nontender, nondistended. Bowel sounds present. No organomegaly or mass.  EXTREMITIES: No cyanosis, clubbing or edema b/l.    NEUROLOGIC: Cranial nerves II through XII are intact. No focal Motor or sensory deficits b/l.  Globally weak PSYCHIATRIC: The patient is alert and oriented x 1.  SKIN: No obvious rash, lesion, or ulcer.     DATA REVIEW:   CBC Recent Labs  Lab 11/13/17 0417  WBC 12.3*  HGB 13.5  HCT 40.3  PLT 254    Chemistries  Recent Labs  Lab 11/12/17 1740  11/15/17 0412  NA 160*   < > 143  K 3.4*   < > 3.7  CL 124*   < > 114*  CO2 23   < > 25  GLUCOSE 146*   < > 116*  BUN 70*   < > 19  CREATININE 1.27*   < > 0.59  CALCIUM 9.9   < > 8.4*  AST 61*  --   --   ALT 126*  --   --   ALKPHOS 57  --   --   BILITOT 1.9*  --   --    < > = values in this interval not displayed.    Cardiac Enzymes Recent Labs  Lab 11/13/17 0417  TROPONINI 0.03*    Microbiology Results  Results for orders placed or performed during the hospital encounter of 11/12/17  Urine culture     Status: Abnormal   Collection Time: 11/12/17  8:00 PM  Result Value Ref Range Status   Specimen Description URINE, CATHETERIZED  Final   Special Requests NONE  Final   Culture 50,000 COLONIES/mL KLEBSIELLA PNEUMONIAE (A)  Final   Report Status 11/15/2017 FINAL  Final   Organism ID, Bacteria KLEBSIELLA PNEUMONIAE (A)  Final      Susceptibility   Klebsiella pneumoniae - MIC*    AMPICILLIN >=32 RESISTANT Resistant     CEFAZOLIN <=4 SENSITIVE Sensitive     CEFTRIAXONE <=1 SENSITIVE Sensitive     CIPROFLOXACIN <=0.25 SENSITIVE Sensitive     GENTAMICIN <=1 SENSITIVE Sensitive     IMIPENEM <=0.25 SENSITIVE Sensitive     NITROFURANTOIN 64 INTERMEDIATE Intermediate     TRIMETH/SULFA <=20 SENSITIVE Sensitive      AMPICILLIN/SULBACTAM 4 SENSITIVE Sensitive     PIP/TAZO <=4 SENSITIVE Sensitive     Extended ESBL NEGATIVE Sensitive     * 50,000 COLONIES/mL KLEBSIELLA PNEUMONIAE  MRSA PCR Screening     Status: None   Collection Time: 11/12/17 11:00 PM  Result Value Ref Range Status  MRSA by PCR NEGATIVE NEGATIVE Final    Comment:        The GeneXpert MRSA Assay (FDA approved for NASAL specimens only), is one component of a comprehensive MRSA colonization surveillance program. It is not intended to diagnose MRSA infection nor to guide or monitor treatment for MRSA infections.     RADIOLOGY:  No results found.    Management plans discussed with the patient, family and they are in agreement.  CODE STATUS:     Code Status Orders  (From admission, onward)        Start     Ordered   11/12/17 2237  Do not attempt resuscitation (DNR)  Continuous    Question Answer Comment  In the event of cardiac or respiratory ARREST Do not call a "code blue"   In the event of cardiac or respiratory ARREST Do not perform Intubation, CPR, defibrillation or ACLS   In the event of cardiac or respiratory ARREST Use medication by any route, position, wound care, and other measures to relive pain and suffering. May use oxygen, suction and manual treatment of airway obstruction as needed for comfort.      11/12/17 2236  Advance Directive Documentation     Most Recent Value  Type of Advance Directive  Out of facility DNR (pink MOST or yellow form), Healthcare Power of Attorney  Pre-existing out of facility DNR order (yellow form or pink MOST form)  Yellow form placed in chart (order not valid for inpatient use)  "MOST" Form in Place?  No data      TOTAL TIME TAKING CARE OF THIS PATIENT: 40 minutes.    Houston Siren M.D on 11/15/2017 at 12:44 PM  Between 7am to 6pm - Pager - [phone]  After 6pm go to [email or web address] - Social research officer, government  [facility] [place] [facility]  Office   [phone]  CC: Primary care physician; Karie Schwalbe, MD

## 2017-11-15 NOTE — Clinical Social Work Note (Signed)
Patient to be d/c'ed today to [facility] room 303B.  Patient and family agreeable to plans will transport via ems RN to call report [phone].  CSW attempted to contact patient's daughter and left a message on voice mail to inform her that patient will be discharging today.  Windell MouldingEric Chapman Matteucci, MSW, Theresia MajorsLCSWA [phone]

## 2017-11-16 DIAGNOSIS — E870 Hyperosmolality and hypernatremia: Secondary | ICD-10-CM | POA: Diagnosis not present

## 2017-11-16 DIAGNOSIS — F39 Unspecified mood [affective] disorder: Secondary | ICD-10-CM | POA: Diagnosis not present

## 2017-11-16 DIAGNOSIS — G301 Alzheimer's disease with late onset: Secondary | ICD-10-CM | POA: Diagnosis not present

## 2017-11-17 DIAGNOSIS — F0280 Dementia in other diseases classified elsewhere without behavioral disturbance: Secondary | ICD-10-CM

## 2017-11-17 DIAGNOSIS — E43 Unspecified severe protein-calorie malnutrition: Secondary | ICD-10-CM

## 2017-11-17 DIAGNOSIS — G309 Alzheimer's disease, unspecified: Secondary | ICD-10-CM | POA: Diagnosis not present

## 2017-11-17 DIAGNOSIS — R627 Adult failure to thrive: Secondary | ICD-10-CM

## 2017-12-20 DIAGNOSIS — E43 Unspecified severe protein-calorie malnutrition: Secondary | ICD-10-CM | POA: Diagnosis not present

## 2017-12-20 DIAGNOSIS — M1990 Unspecified osteoarthritis, unspecified site: Secondary | ICD-10-CM | POA: Diagnosis not present

## 2017-12-20 DIAGNOSIS — F39 Unspecified mood [affective] disorder: Secondary | ICD-10-CM | POA: Diagnosis not present

## 2017-12-20 DIAGNOSIS — G309 Alzheimer's disease, unspecified: Secondary | ICD-10-CM | POA: Diagnosis not present

## 2018-01-20 ENCOUNTER — Emergency Department
Admission: EM | Admit: 2018-01-20 | Discharge: 2018-01-20 | Disposition: A | Discharge status: Skilled Nursing Facility | Attending: Emergency Medicine | Admitting: Emergency Medicine

## 2018-01-20 ENCOUNTER — Emergency Department

## 2018-01-20 ENCOUNTER — Other Ambulatory Visit: Payer: Self-pay

## 2018-01-20 DIAGNOSIS — Z87891 Personal history of nicotine dependence: Secondary | ICD-10-CM | POA: Insufficient documentation

## 2018-01-20 DIAGNOSIS — Y92128 Other place in nursing home as the place of occurrence of the external cause: Secondary | ICD-10-CM | POA: Diagnosis not present

## 2018-01-20 DIAGNOSIS — W19XXXA Unspecified fall, initial encounter: Secondary | ICD-10-CM | POA: Insufficient documentation

## 2018-01-20 DIAGNOSIS — S0101XA Laceration without foreign body of scalp, initial encounter: Secondary | ICD-10-CM | POA: Insufficient documentation

## 2018-01-20 DIAGNOSIS — S0990XA Unspecified injury of head, initial encounter: Secondary | ICD-10-CM | POA: Diagnosis present

## 2018-01-20 DIAGNOSIS — Y999 Unspecified external cause status: Secondary | ICD-10-CM | POA: Diagnosis not present

## 2018-01-20 DIAGNOSIS — Y9389 Activity, other specified: Secondary | ICD-10-CM | POA: Insufficient documentation

## 2018-01-20 DIAGNOSIS — M25559 Pain in unspecified hip: Secondary | ICD-10-CM | POA: Diagnosis not present

## 2018-01-20 DIAGNOSIS — F0390 Unspecified dementia without behavioral disturbance: Secondary | ICD-10-CM | POA: Diagnosis not present

## 2018-01-20 NOTE — ED Triage Notes (Signed)
Pt arrived via ems for report of fall - pt hit the back of her head and sustained a laceration - bleeding is controlled at this time - pt also c/o bilat hip pain

## 2018-01-20 NOTE — ED Notes (Signed)

## 2018-01-20 NOTE — ED Provider Notes (Signed)
[facility] Emergency Department Provider Note       Time seen: ----------------------------------------- 6:36 PM on 01/20/2018 ----------------------------------------- Level V caveat: History/ROS limited by dementia  I have reviewed the triage vital signs and the nursing notes.  HISTORY   Chief Complaint Fall; Head Laceration; and Hip Pain    HPI Donna Moss is a 77 y.o. female with a history of anxiety, dementia, depression and hyperlipidemia who presents to the ED for an unwitnessed fall that occurred at [facility].  Reportedly she had complained of hip pain prior to arrival and was noted to be bleeding from a posterior scalp wound.  No further information is available.  Past Medical History:  Diagnosis Date  . Anxiety   . Dementia   . Depression   . HLD (hyperlipidemia)     Patient Active Problem List   Diagnosis Date Noted  . Protein-calorie malnutrition, severe 11/15/2017  . Pressure injury of skin 11/13/2017  . UTI (urinary tract infection) 11/12/2017  . Hypernatremia 11/12/2017  . Anxiety 11/12/2017  . AKI (acute kidney injury) (HCC) 11/12/2017  . Closed left hip fracture (HCC) 07/05/2017  . HLD (hyperlipidemia) 07/05/2017  . Dementia 07/05/2017    Past Surgical History:  Procedure Laterality Date  . COLONOSCOPY    . HIP ARTHROPLASTY Left 07/06/2017   Procedure: ARTHROPLASTY BIPOLAR HIP (HEMIARTHROPLASTY);  Surgeon: Kennedy BuckerMenz, Michael, MD;  Location: [facility] ORS;  Service: Orthopedics;  Laterality: Left;    Allergies Penicillins  Social History Social History   Tobacco Use  . Smoking status: Former Smoker    Last attempt to quit: 10/02/1989    Years since quitting: 28.3  . Smokeless tobacco: Never Used  Substance Use Topics  . Alcohol use: No  . Drug use: No    Review of Systems Unknown at this time, complaining of hip pain earlier and has had bleeding from a scalp wound.  All systems negative/normal/unremarkable except  as stated in the HPI  ____________________________________________   PHYSICAL EXAM:  VITAL SIGNS: ED Triage Vitals  Enc Vitals Group     BP 01/20/18 1832 133/70     Pulse --      Resp --      Temp 01/20/18 1832 (!) 97.5 F (36.4 C)     Temp Source 01/20/18 1832 Axillary     SpO2 --      Weight 01/20/18 1830 100 lb (45.4 kg)     Height 01/20/18 1830 5\' 4"  (1.626 m)     Head Circumference --      Peak Flow --      Pain Score --      Pain Loc --      Pain Edu? --      Excl. in GC? --    Constitutional: Alert but disoriented. Well appearing and in no distress. Eyes: Conjunctivae are normal. Normal extraocular movements. ENT   Head: Normocephalic, 1 cm superficial midline parietal scalp laceration   Nose: No congestion/rhinnorhea.   Mouth/Throat: Mucous membranes are moist.   Neck: No stridor. Cardiovascular: Normal rate, regular rhythm. No murmurs, rubs, or gallops. Respiratory: Normal respiratory effort without tachypnea nor retractions. Breath sounds are clear and equal bilaterally. No wheezes/rales/rhonchi. Gastrointestinal: Soft and nontender. Normal bowel sounds Musculoskeletal: Nontender with normal range of motion in extremities. No lower extremity tenderness nor edema. Neurologic:  Normal speech and language. No gross focal neurologic deficits are appreciated.  Skin: Small posterior scalp laceration ____________________________________________  ED COURSE:  As part of my medical  decision making, I reviewed the following data within the electronic MEDICAL RECORD NUMBER History obtained from family if available, nursing notes, old chart and ekg, as well as notes from prior ED visits. Patient presented for a fall that occurred at the nursing home, we will assess with imaging as indicated at this time.   Procedures ____________________________________________   RADIOLOGY Images were viewed by me  CT head, pelvis x-ray  IMPRESSION: 1. No evidence of acute  intracranial abnormality. 2. Posterior LEFT scalp soft tissue swelling without underlying fracture. 3. Ventriculomegaly again noted which may be related to central atrophy/chronic small-vessel disease but slightly out of proportion to those expected changes. Communicating or normal pressure hydrocephalus is a consideration and correlate clinically.  Pelvis x-rays are unremarkable ____________________________________________  DIFFERENTIAL DIAGNOSIS   Subdural hematoma, skull fracture, scalp laceration, pelvic fracture, hip fracture  FINAL ASSESSMENT AND PLAN  Fall, minor head injury  Plan: Patient had presented for a fall that occurred at [facility] Patient's imaging was negative for any acute process.  She is stable for outpatient follow-up.   Ulice Dash, MD   Note: This note was generated in part or whole with voice recognition software. Voice recognition is usually quite accurate but there are transcription errors that can and very often do occur. I apologize for any typographical errors that were not detected and corrected.     Emily Filbert, MD 01/20/18 Ernestina Columbia

## 2018-02-12 DIAGNOSIS — E441 Mild protein-calorie malnutrition: Secondary | ICD-10-CM | POA: Diagnosis not present

## 2018-02-12 DIAGNOSIS — G301 Alzheimer's disease with late onset: Secondary | ICD-10-CM | POA: Diagnosis not present

## 2018-02-12 DIAGNOSIS — F39 Unspecified mood [affective] disorder: Secondary | ICD-10-CM | POA: Diagnosis not present

## 2018-02-12 DIAGNOSIS — M159 Polyosteoarthritis, unspecified: Secondary | ICD-10-CM | POA: Diagnosis not present

## 2018-02-23 DIAGNOSIS — E43 Unspecified severe protein-calorie malnutrition: Secondary | ICD-10-CM | POA: Diagnosis not present

## 2018-02-23 DIAGNOSIS — R627 Adult failure to thrive: Secondary | ICD-10-CM | POA: Diagnosis not present

## 2018-02-23 DIAGNOSIS — F0280 Dementia in other diseases classified elsewhere without behavioral disturbance: Secondary | ICD-10-CM | POA: Diagnosis not present

## 2018-02-23 DIAGNOSIS — G309 Alzheimer's disease, unspecified: Secondary | ICD-10-CM | POA: Diagnosis not present

## 2018-03-19 DIAGNOSIS — L219 Seborrheic dermatitis, unspecified: Secondary | ICD-10-CM | POA: Diagnosis not present

## 2018-04-13 DIAGNOSIS — F39 Unspecified mood [affective] disorder: Secondary | ICD-10-CM

## 2018-04-13 DIAGNOSIS — M1990 Unspecified osteoarthritis, unspecified site: Secondary | ICD-10-CM

## 2018-04-13 DIAGNOSIS — E43 Unspecified severe protein-calorie malnutrition: Secondary | ICD-10-CM | POA: Diagnosis not present

## 2018-04-13 DIAGNOSIS — G309 Alzheimer's disease, unspecified: Secondary | ICD-10-CM | POA: Diagnosis not present

## 2018-05-24 DIAGNOSIS — R400 Somnolence: Secondary | ICD-10-CM | POA: Diagnosis not present

## 2018-06-07 DIAGNOSIS — M159 Polyosteoarthritis, unspecified: Secondary | ICD-10-CM | POA: Diagnosis not present

## 2018-06-07 DIAGNOSIS — E441 Mild protein-calorie malnutrition: Secondary | ICD-10-CM | POA: Diagnosis not present

## 2018-06-07 DIAGNOSIS — F39 Unspecified mood [affective] disorder: Secondary | ICD-10-CM

## 2018-06-07 DIAGNOSIS — G301 Alzheimer's disease with late onset: Secondary | ICD-10-CM | POA: Diagnosis not present

## 2018-08-28 DEATH — deceased

## 2019-05-12 IMAGING — DX DG HIP (WITH OR WITHOUT PELVIS) 2-3V*L*
2 series · 2 of 2 positions shown · non-contrast
Comparison: 07/05/2017

CLINICAL DATA: Post hip replacement.  Postop pain.

EXAM:
DG HIP (WITH OR WITHOUT PELVIS) 2-3V LEFT

[hip ap]
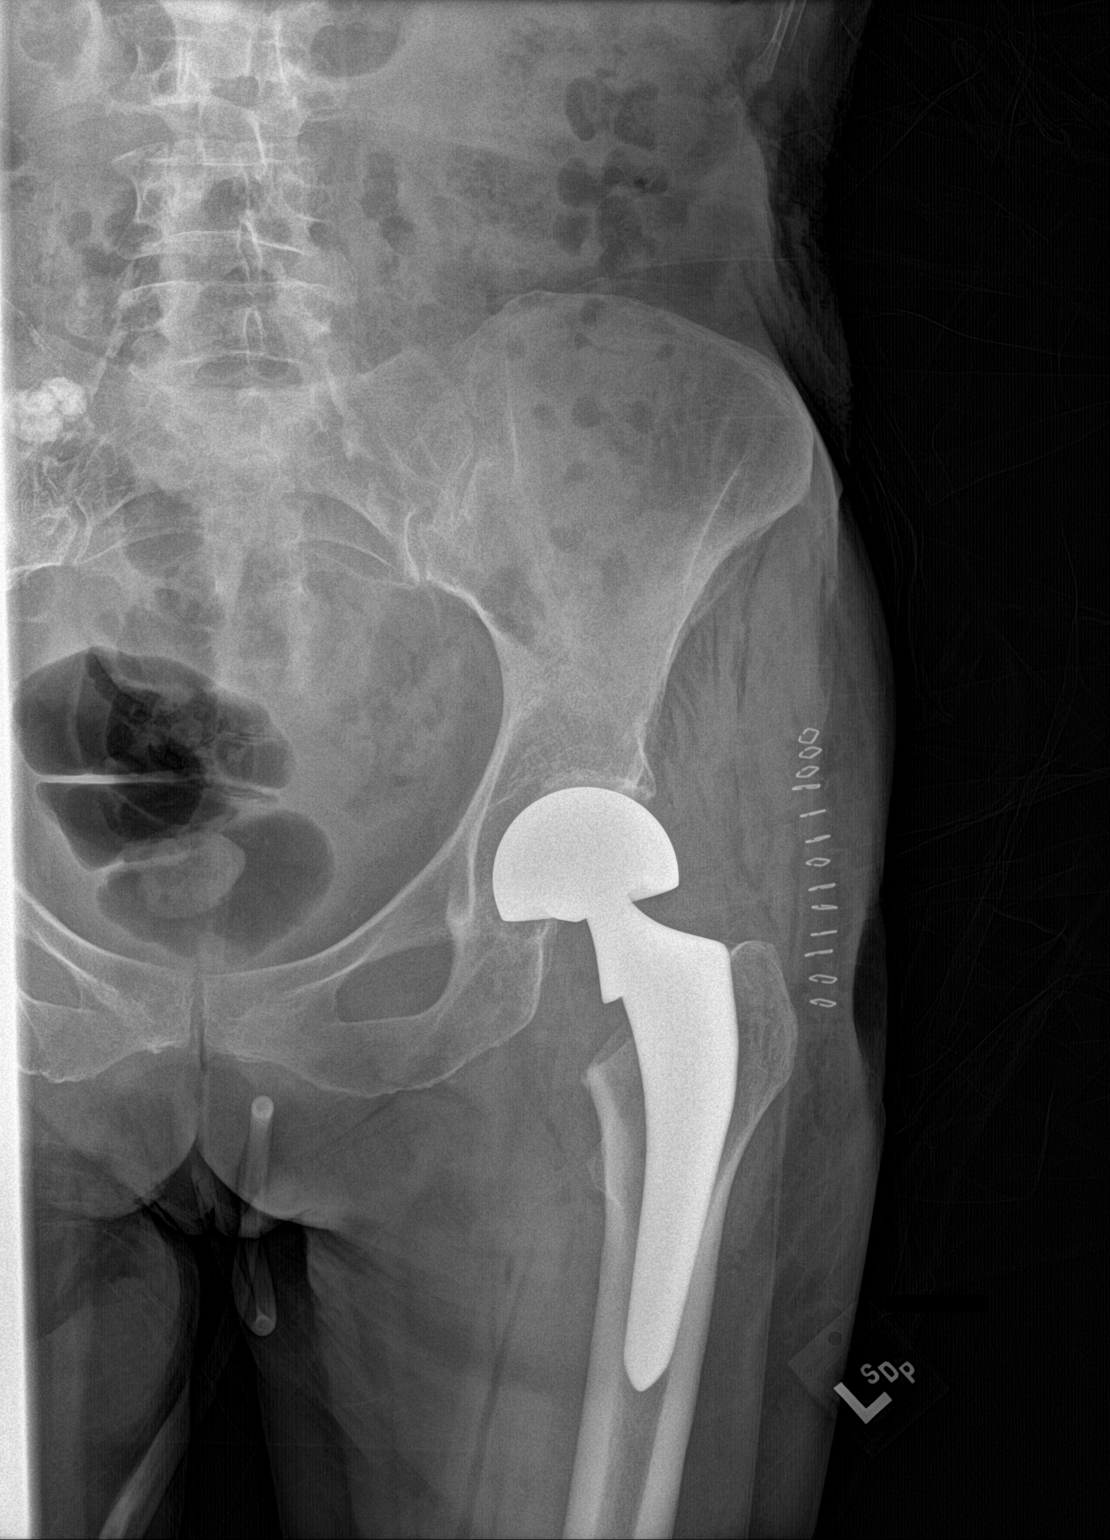

[hip lat]
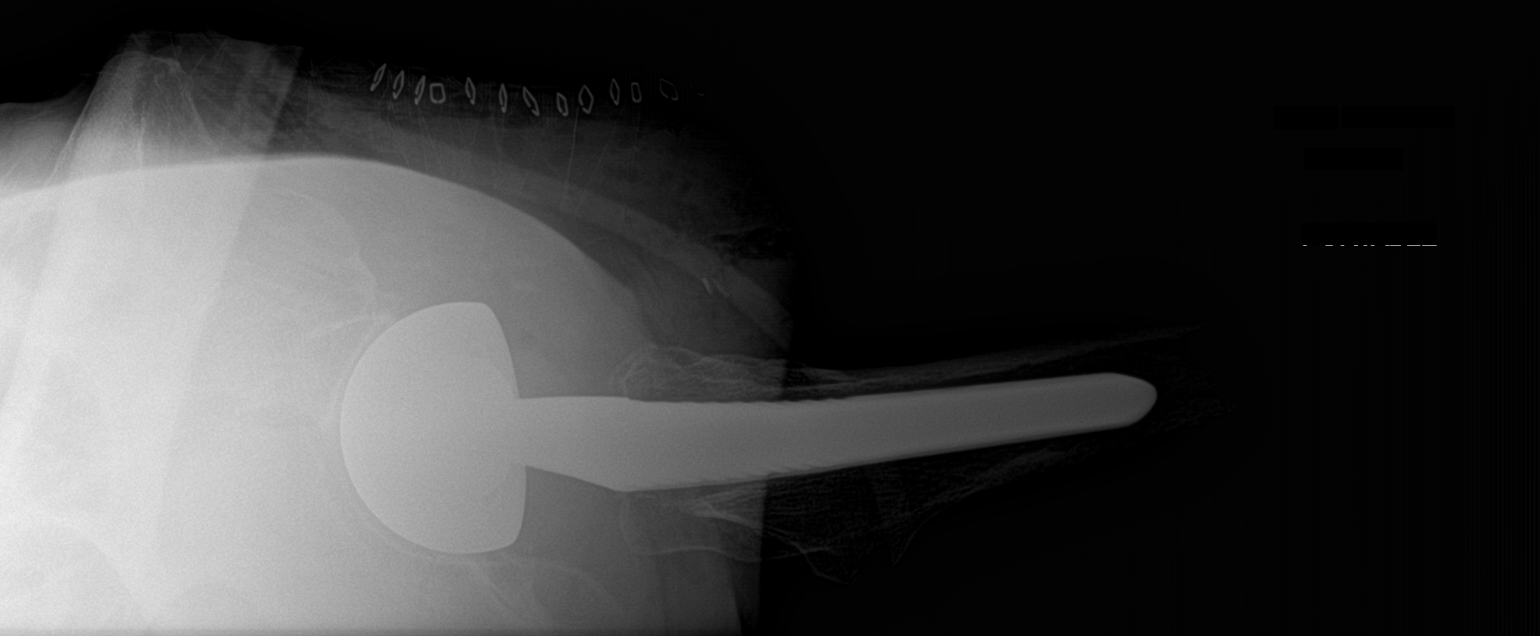

[2 of 2 positions shown; findings below may reference images not displayed]

FINDINGS: Interval left hemiarthroplasty. No periprosthetic fracture or other
acute complication identified. Vascular calcifications. A right
pelvic calcification is nonspecific.
IMPRESSION: Expected appearance after left hemiarthroplasty.

## 2019-05-12 IMAGING — XA DG HIP (WITH PELVIS) OPERATIVE*L*
1 series · 1 of 1 positions shown · non-contrast
Comparison: None.

CLINICAL DATA: Left hip arthroplasty.

EXAM:
OPERATIVE LEFT HIP (WITH PELVIS IF PERFORMED) 1 VIEW
TECHNIQUE: Fluoroscopic spot image(s) were submitted for interpretation
post-operatively.

[Series 3: ortho standard · 1 of 1 slices shown]
[im 1/1]
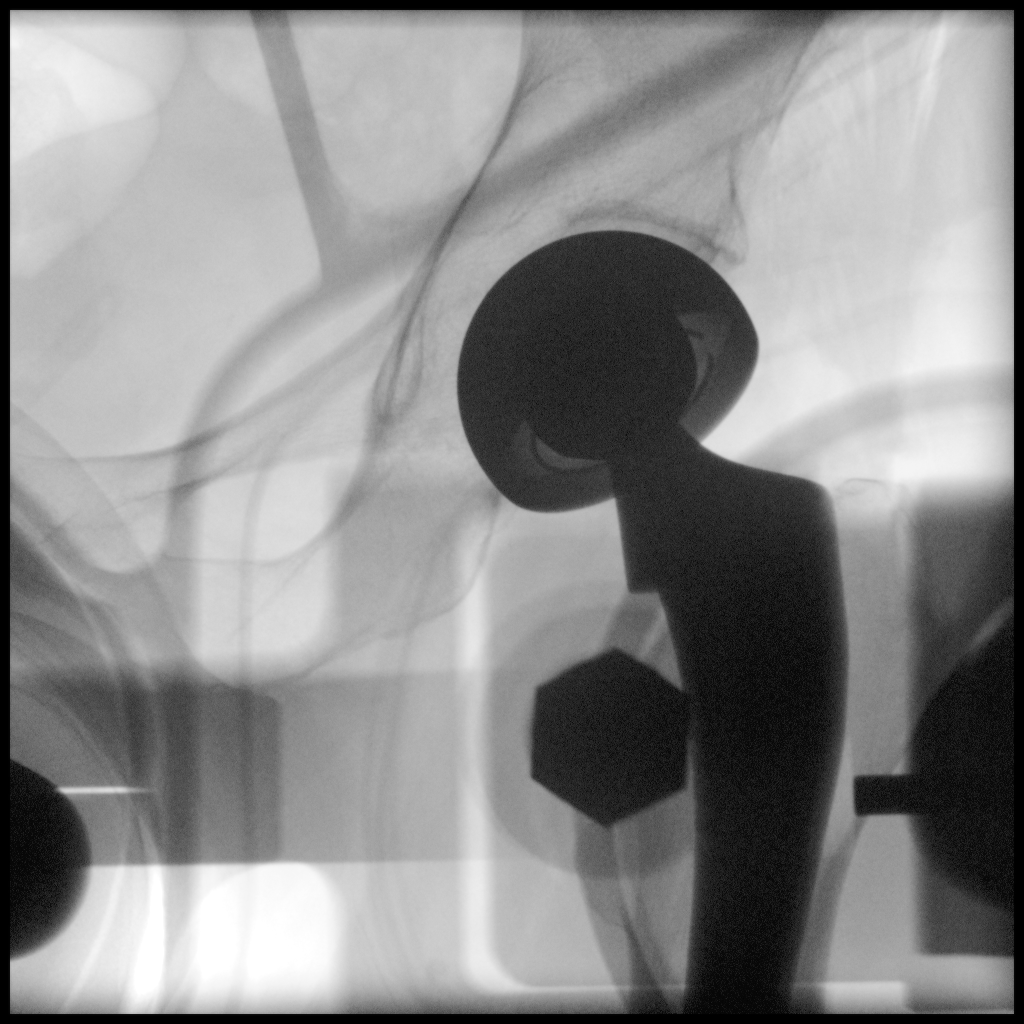

[1 of 1 positions shown; findings below may reference images not displayed]

FINDINGS: Intraoperative image demonstrates the proximal portion of a bipolar
hemiarthroplasty of the left hip. The femoral stem is not completely
visualized. Alignment appears normal at the level of the hip.
IMPRESSION: Normal alignment of visualized left hip bipolar hemiarthroplasty.

## 2019-11-26 IMAGING — CR DG PELVIS 1-2V
1 series · 1 of 1 positions shown · non-contrast
Comparison: 07/06/2017 and prior radiographs

CLINICAL DATA: Acute pelvic pain following fall. Initial encounter.

EXAM:
PELVIS - 1-2 VIEW

[pelvis ap]
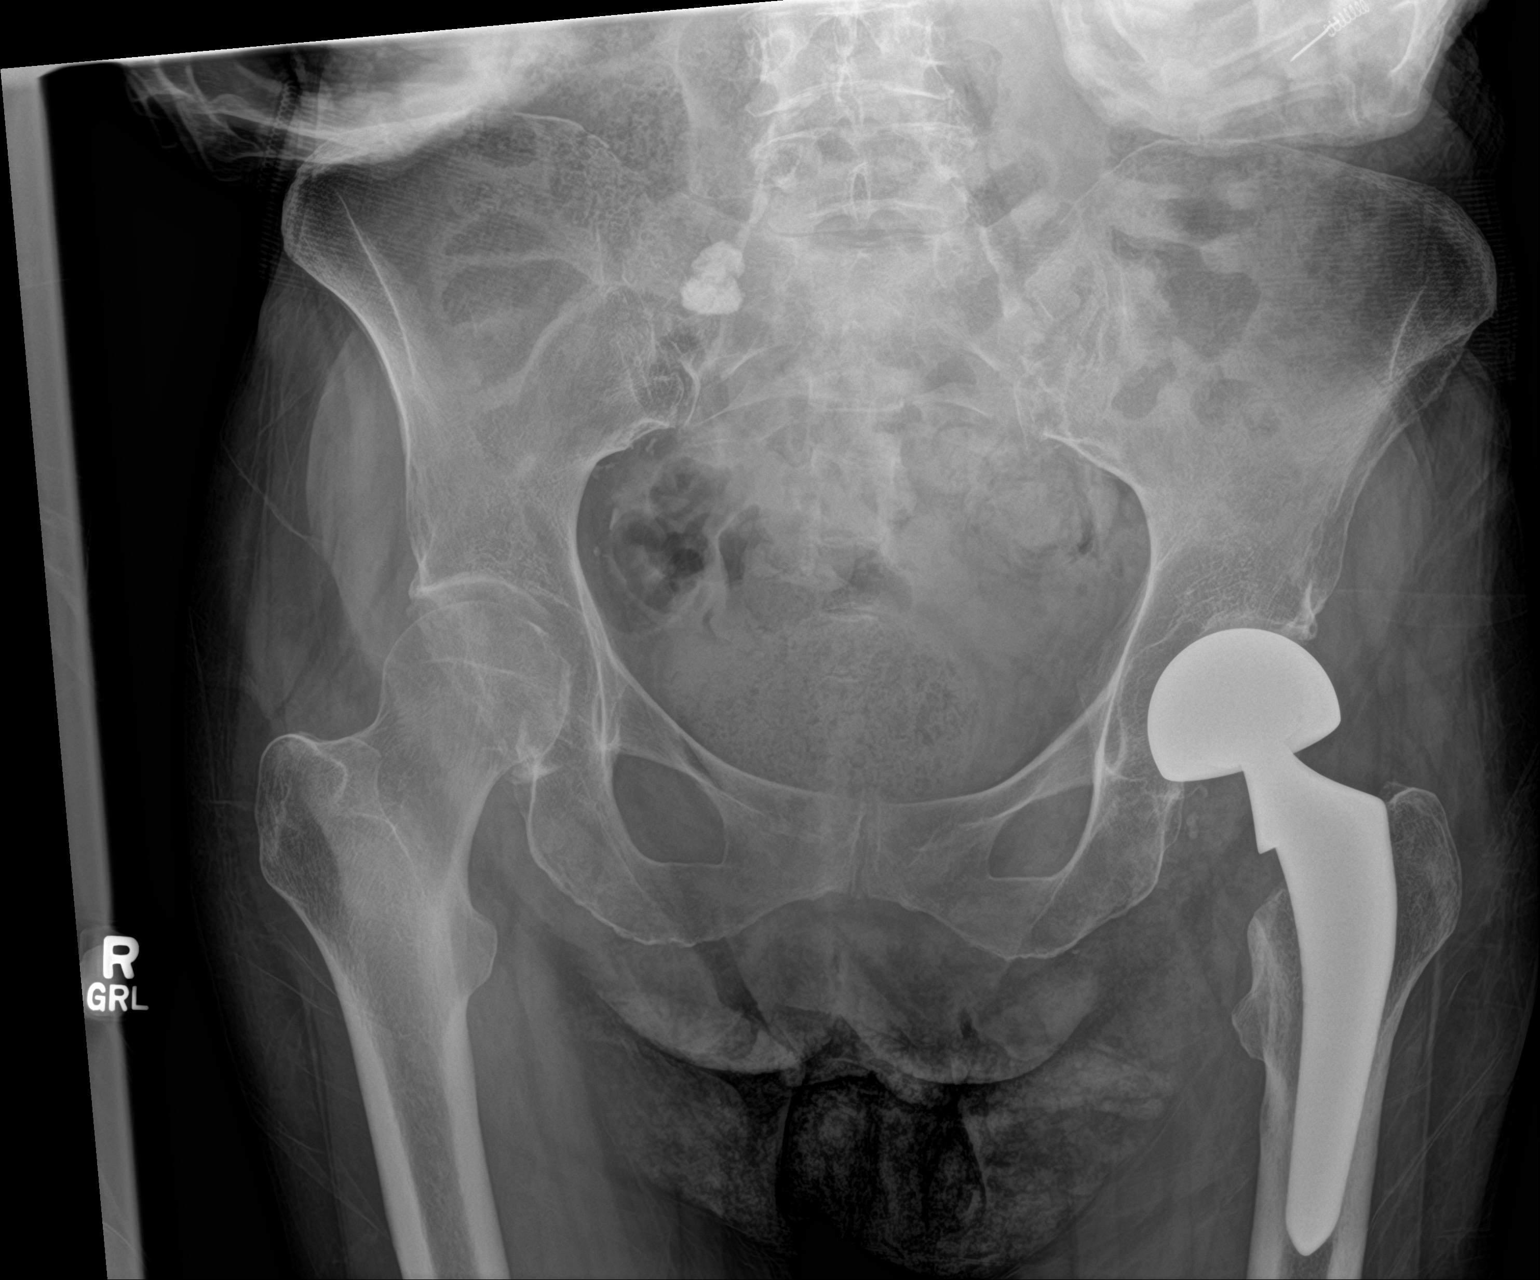

[1 of 1 positions shown; findings below may reference images not displayed]

FINDINGS: There is no evidence of acute fracture, subluxation or dislocation.

LEFT hip hemiarthroplasty changes again noted.

No suspicious focal bony lesions are present.

Mild degenerative changes in the LOWER lumbar spine are present.
IMPRESSION: No acute abnormality.
